# Patient Record
Sex: Female | Born: 1950 | Race: White | Hispanic: No | Marital: Single | Smoking: Never smoker
Health system: Southern US, Community
[De-identification: ages and names within clinical notes are randomized; demographics above are authoritative.]

## PROBLEM LIST (undated history)

## (undated) DIAGNOSIS — I10 Essential (primary) hypertension: Secondary | ICD-10-CM

## (undated) DIAGNOSIS — E7800 Pure hypercholesterolemia, unspecified: Secondary | ICD-10-CM

## (undated) DIAGNOSIS — K219 Gastro-esophageal reflux disease without esophagitis: Secondary | ICD-10-CM

## (undated) HISTORY — PX: OTHER SURGICAL HISTORY: SHX169

## (undated) HISTORY — PX: BREAST SURGERY: SHX581

## (undated) HISTORY — PX: UTERINE FIBROID SURGERY: SHX826

---

## 2008-10-04 ENCOUNTER — Ambulatory Visit: Payer: Self-pay

## 2012-01-17 ENCOUNTER — Ambulatory Visit: Payer: Self-pay | Admitting: Specialist

## 2012-01-25 ENCOUNTER — Ambulatory Visit: Payer: Self-pay | Admitting: Specialist

## 2012-05-22 ENCOUNTER — Ambulatory Visit: Payer: Self-pay | Admitting: Specialist

## 2013-10-26 ENCOUNTER — Ambulatory Visit: Payer: Self-pay | Admitting: Unknown Physician Specialty

## 2013-10-28 LAB — PATHOLOGY REPORT

## 2013-12-27 ENCOUNTER — Emergency Department: Payer: Self-pay | Admitting: Emergency Medicine

## 2014-06-22 NOTE — Op Note (Signed)
PATIENT NAME:  Karla Martin, Karla Martin MR#:  [id number] DATE OF BIRTH:  04-25-1950  DATE OF PROCEDURE:  01/25/2012  PREOPERATIVE DIAGNOSES:  1. Right carpal tunnel syndrome.  2. Left wrist mass.   POSTOPERATIVE DIAGNOSES:  1. Right carpal tunnel syndrome.  2. Left wrist mass.   PROCEDURES:  1. Right carpal tunnel release.  2. Attempted aspiration of mass, left wrist.   SURGEON: Christophe Louis, M.D.   ANESTHESIA: General.   COMPLICATIONS: None.  TOURNIQUET TIME: Approximately 20 minutes.   DESCRIPTION OF PROCEDURE: After adequate induction of general anesthesia, the right upper extremity is thoroughly prepped with alcohol and ChloraPrep and draped in standard sterile fashion. The extremity is wrapped out with the Esmarch bandage and pneumatic tourniquet elevated to 250 mmHg. Under loupe magnification, standard volar carpal tunnel incision is made and the dissection carried down to the transverse retinacular ligament. The ligament is incised in the midportion with the knife under direct vision. The distal release is performed with the small scissors and the proximal release is performed with the small scissors and the carpal tunnel scissors. Careful check is made to be sure that complete release had been obtained, both proximally and distally. There is seen to be moderate compression of the nerve directly beneath the ligament. The wound is thoroughly irrigated multiple times. Skin edges are infiltrated with 0.5% plain Marcaine. The skin is closed with 4-0 nylon, a soft bulky dressing is applied, and the tourniquet is released. At the left wrist, the area over the mass, at the radial aspect of the wrist, is fully prepped with alcohol and a 10 mL syringe and 18-gauge needle are used to attempt to aspirate any fluid from the mass and none could be obtained. Compression is applied along with the Band-Aid. The patient is returned to the recovery room in satisfactory condition having tolerated the procedure  quite well. ____________________________ Lucas Mallow, MD ces:slb D: 01/25/2012 08:38:50 ET T: 01/25/2012 10:14:54 ET JOB#: [id number]  cc: Lucas Mallow, MD, <Dictator> Lucas Mallow MD ELECTRONICALLY SIGNED 02/03/2012 13:42

## 2015-05-26 ENCOUNTER — Ambulatory Visit (INDEPENDENT_AMBULATORY_CARE_PROVIDER_SITE_OTHER): Payer: Managed Care, Other (non HMO)

## 2015-05-26 ENCOUNTER — Ambulatory Visit (INDEPENDENT_AMBULATORY_CARE_PROVIDER_SITE_OTHER): Payer: Managed Care, Other (non HMO) | Admitting: Podiatry

## 2015-05-26 ENCOUNTER — Encounter: Payer: Self-pay | Admitting: Podiatry

## 2015-05-26 VITALS — BP 130/73 | HR 80 | Resp 18

## 2015-05-26 DIAGNOSIS — S92912A Unspecified fracture of left toe(s), initial encounter for closed fracture: Secondary | ICD-10-CM

## 2015-05-26 DIAGNOSIS — R52 Pain, unspecified: Secondary | ICD-10-CM | POA: Diagnosis not present

## 2015-05-26 DIAGNOSIS — S92919A Unspecified fracture of unspecified toe(s), initial encounter for closed fracture: Secondary | ICD-10-CM | POA: Insufficient documentation

## 2015-05-26 NOTE — Patient Instructions (Signed)
Toe Fracture °A toe fracture is a break in one of the toe bones (phalanges). °CAUSES °This condition may be caused by: °· Dropping a heavy object on your toe. °· Stubbing your toe. °· Overusing your toe or doing repetitive exercise. °· Twisting or stretching your toe out of place. °RISK FACTORS °This condition is more likely to develop in people who: °· Play contact sports. °· Have a bone disease. °· Have a low calcium level. °SYMPTOMS °The main symptoms of this condition are swelling and pain in the toe. The pain may get worse with standing or walking. Other symptoms include: °· Bruising. °· Stiffness. °· Numbness. °· A change in the way the toe looks. °· Broken bones that poke through the skin. °· Blood beneath the toenail. °DIAGNOSIS °This condition is diagnosed with a physical exam. You may also have X-rays. °TREATMENT  °Treatment for this condition depends on the type of fracture and its severity. Treatment may involve: °· Taping the broken toe to a toe that is next to it (buddy taping). This is the most common treatment for fractures in which the bone has not moved out of place (nondisplaced fracture). °· Wearing a shoe that has a wide, rigid sole to protect the toe and to limit its movement. °· Wearing a walking cast. °· Having a procedure to move the toe back into place. °· Surgery. This may be needed: °¨ If there are many pieces of broken bone that are out of place (displaced). °¨ If the toe joint breaks. °¨ If the bone breaks through the skin. °· Physical therapy. This is done to help regain movement and strength in the toe. °You may need follow-up X-rays to make sure that the bone is healing well and staying in position. °HOME CARE INSTRUCTIONS °If You Have a Cast: °· Do not stick anything inside the cast to scratch your skin. Doing that increases your risk of infection. °· Check the skin around the cast every day. Report any concerns to your health care provider. You may put lotion on dry skin around the  edges of the cast. Do not apply lotion to the skin underneath the cast. °· Do not put pressure on any part of the cast until it is fully hardened. This may take several hours. °· Keep the cast clean and dry. °Bathing °· Do not take baths, swim, or use a hot tub until your health care provider approves. Ask your health care provider if you can take showers. You may only be allowed to take sponge baths for bathing. °· If your health care provider approves bathing and showering, cover the cast or bandage (dressing) with a watertight plastic bag to protect it from water. Do not let the cast or dressing get wet. °Managing Pain, Stiffness, and Swelling °· If you do not have a cast, apply ice to the injured area, if directed. °¨ Put ice in a plastic bag. °¨ Place a towel between your skin and the bag. °¨ Leave the ice on for 20 minutes, 2-3 times per day. °· Move your toes often to avoid stiffness and to lessen swelling. °· Raise (elevate) the injured area above the level of your heart while you are sitting or lying down. °Driving °· Do not drive or operate heavy machinery while taking pain medicine. °· Do not drive while wearing a cast on a foot that you use for driving. °Activity °· Return to your normal activities as directed by your health care provider. Ask your health care   provider what activities are safe for you. °· Perform exercises daily as directed by your health care provider or physical therapist. °Safety °· Do not use the injured limb to support your body weight until your health care provider says that you can. Use crutches or other assistive devices as directed by your health care provider. °General Instructions °· If your toe was treated with buddy taping, follow your health care provider's instructions for changing the gauze and tape. Change it more often: °¨ The gauze and tape get wet. If this happens, dry the space between the toes. °¨ The gauze and tape are too tight and cause your toe to become pale  or numb. °· Wear a protective shoe as directed by your health care provider. If you were not given a protective shoe, wear sturdy, supportive shoes. Your shoes should not pinch your toes and should not fit tightly against your toes. °· Do not use any tobacco products, including cigarettes, chewing tobacco, or e-cigarettes. Tobacco can delay bone healing. If you need help quitting, ask your health care provider. °· Take medicines only as directed by your health care provider. °· Keep all follow-up visits as directed by your health care provider. This is important. °SEEK MEDICAL CARE IF: °· You have a fever. °· Your pain medicine is not helping. °· Your toe is cold. °· Your toe is numb. °· You still have pain after one week of rest and treatment. °· You still have pain after your health care provider has said that you can start walking again. °· You have pain, tingling, or numbness in your foot that is not going away. °SEEK IMMEDIATE MEDICAL CARE IF: °· You have severe pain. °· You have redness or inflammation in your toe that is getting worse. °· You have pain or numbness in your toe that is getting worse. °· Your toe turns blue. °  °This information is not intended to replace advice given to you by your health care provider. Make sure you discuss any questions you have with your health care provider. °  °Document Released: 02/17/2000 Document Revised: 11/10/2014 Document Reviewed: 12/16/2013 °Elsevier Interactive Patient Education ©2016 [employer] ° °

## 2015-05-26 NOTE — Progress Notes (Signed)
   Subjective:    Patient ID: Karla Martin, female    DOB: [birth year], 65 y.o.   MRN: [id number]  HPI  65 year old female presents the also concerns of left fifth toe pain. She states that she stubbed her toe at work at a desk about 4 weeks ago. She said that she has some swelling and she is been icing it has slowly been getting better although does continue to be somewhat painful. She states that she can wear open toed shoes or any problems that when she wears a loafer it is the pressure to the area is painful. No other treatment. No other complaints.  Review of Systems  All other systems reviewed and are negative.      Objective:   Physical Exam General: AAO x3, NAD  Dermatological: Skin is warm, dry and supple bilateral. Nails x 10 are well manicured; remaining integument appears unremarkable at this time. There are no open sores, no preulcerative lesions, no rash or signs of infection present.  Vascular: Dorsalis Pedis artery and Posterior Tibial artery pedal pulses are 2/4 bilateral with immedate capillary fill time. Pedal hair growth present. No varicosities and no lower extremity edema present bilateral. There is no pain with calf compression, swelling, warmth, erythema.   Neruologic: Grossly intact via light touch bilateral. Vibratory intact via tuning fork bilateral. Protective threshold with Semmes Wienstein monofilament intact to all pedal sites bilateral. Patellar and Achilles deep tendon reflexes 2+ bilateral. No Babinski or clonus noted bilateral.   Musculoskeletal: There is mild tenderness palpation to the base of the left fifth proximal phalanx. There is pain upon abduction of the toe. There is slight edema along the base the toe without any erythema or increase in warmth. No open lesion. The toes at the rectus position. No tenderness the metatarsal. No other areas of tenderness to bilateral lower extremity is.  Gait: Unassisted, Nonantalgic.       Assessment & Plan:    65 year old female left fifth digit fracture -Treatment options discussed including all alternatives, risks, and complications -X-rays were obtained and reviewed with the patient. There does appear to be a radiolucent line on the base of the proximal pharynx the left fifth toe consistent with a fracture. -At this time recommended to tape the toe daily for splinting and to help control the swelling. Ice to the area. Wear open toed shoe. -Follow-up in 3-4 weeks if symptoms continue or sooner if any issues are to arise. Call any questions or concerns the meantime.  Celesta Gentile, DPM

## 2015-09-13 ENCOUNTER — Ambulatory Visit: Payer: Managed Care, Other (non HMO)

## 2015-09-13 ENCOUNTER — Ambulatory Visit (INDEPENDENT_AMBULATORY_CARE_PROVIDER_SITE_OTHER): Payer: Managed Care, Other (non HMO) | Admitting: Podiatry

## 2015-09-13 ENCOUNTER — Ambulatory Visit (INDEPENDENT_AMBULATORY_CARE_PROVIDER_SITE_OTHER): Payer: Managed Care, Other (non HMO)

## 2015-09-13 DIAGNOSIS — M7741 Metatarsalgia, right foot: Secondary | ICD-10-CM | POA: Diagnosis not present

## 2015-09-13 DIAGNOSIS — M79671 Pain in right foot: Secondary | ICD-10-CM

## 2015-09-13 DIAGNOSIS — M779 Enthesopathy, unspecified: Secondary | ICD-10-CM | POA: Diagnosis not present

## 2015-09-13 NOTE — Progress Notes (Signed)
Patient ID: Karla Martin, female   DOB: 09-Feb-1951, 65 y.o.   MRN: [id number]  Subjective: 65 year old female presents the office today for concerns of pain in the ball of her foot on the right side which is been ongoing for about 1 month. She has been taping her toes together and this has been helping her pain. She denies any recent injury or trauma to the area. Denies any swelling or redness. She's dusted the pain has gotten better since it started. She denies any change or increase in activity. No numbness or tingling to her toes. No other treatment.  Denies any systemic complaints such as fevers, chills, nausea, vomiting. No acute changes since last appointment, and no other complaints at this time.   Objective: AAO x3, NAD DP/PT pulses palpable bilaterally, CRT less than 3 seconds At this time there is no tenderness palpation on the metatarsals or with the interspaces. There is no pain or crepitation first MTPJ range of motion. She states the office on that she's having pain is when she does stand or put pressure to her foot and toes lateral walking. There does appear to be a very faint amount of edema to the dorsal aspect of the second and fourth MPJs without any erythema or increase in warmth. There is no fluctuance or crepitus. No open sores. Mild restriction in dorsiflexion right first MTPJ range of motion. No areas of pinpoint bony tenderness or pain with vibratory sensation. MMT 5/5, ROM WNL. No edema, erythema, increase in warmth to bilateral lower extremities.  No open lesions or pre-ulcerative lesions.  No pain with calf compression, swelling, warmth, erythema  Assessment: Right foot capsulitis, metatarsalgia  Plan: -All treatment options discussed with the patient including all alternatives, risks, complications.  -X-rays were obtained and reviewed with the patient. No definitive evidence of acute fracture or stress fracture at this time. -She states that his does better when she  wears a more supportive shoe and I discussed with her shoe gear modifications as well as orthotics to help support the arch of her foot. Also dispensed metatarsal pads. Prescribed meloxicam to take as needed as well as discussing side effects the medication. Ice to the area. Follow-up in 4 to symptoms continue or sooner if any issues are to arise.  Celesta Gentile, DPM

## 2015-10-11 ENCOUNTER — Ambulatory Visit: Payer: Managed Care, Other (non HMO) | Admitting: Podiatry

## 2017-09-27 ENCOUNTER — Encounter: Payer: Self-pay | Admitting: Emergency Medicine

## 2017-09-27 ENCOUNTER — Emergency Department: Payer: Medicare Other

## 2017-09-27 ENCOUNTER — Emergency Department
Admission: EM | Admit: 2017-09-27 | Discharge: 2017-09-27 | Disposition: A | Discharge status: Home / Self Care | Payer: Medicare Other | Attending: Emergency Medicine | Admitting: Emergency Medicine

## 2017-09-27 DIAGNOSIS — Z79899 Other long term (current) drug therapy: Secondary | ICD-10-CM | POA: Diagnosis not present

## 2017-09-27 DIAGNOSIS — R079 Chest pain, unspecified: Secondary | ICD-10-CM | POA: Diagnosis not present

## 2017-09-27 DIAGNOSIS — Z7982 Long term (current) use of aspirin: Secondary | ICD-10-CM | POA: Diagnosis not present

## 2017-09-27 LAB — CBC
HCT: 37.3 % (ref 35.0–47.0)
Hemoglobin: 12.9 g/dL (ref 12.0–16.0)
MCH: 30.9 pg (ref 26.0–34.0)
MCHC: 34.6 g/dL (ref 32.0–36.0)
MCV: 89.3 fL (ref 80.0–100.0)
PLATELETS: 230 10*3/uL (ref 150–440)
RBC: 4.17 MIL/uL (ref 3.80–5.20)
RDW: 13.1 % (ref 11.5–14.5)
WBC: 5 10*3/uL (ref 3.6–11.0)

## 2017-09-27 LAB — TROPONIN I
Troponin I: <0.03 ng/mL (ref <0.03)
Troponin I: <0.03 ng/mL (ref <0.03)

## 2017-09-27 LAB — BASIC METABOLIC PANEL
Anion gap: 7 (ref 5–15)
BUN: 16 mg/dL (ref 8–23)
CALCIUM: 9.4 mg/dL (ref 8.9–10.3)
CO2: 27 mmol/L (ref 22–32)
CREATININE: 0.76 mg/dL (ref 0.44–1.00)
Chloride: 106 mmol/L (ref 98–111)
GFR calc Af Amer: >60 mL/min (ref >60)
GLUCOSE: 120 mg/dL — AB (ref 70–99)
Potassium: 4 mmol/L (ref 3.5–5.1)
Sodium: 140 mmol/L (ref 135–145)

## 2017-09-27 MED ORDER — KETAMINE HCL 10 MG/ML IJ SOLN: 0.3000 mg/kg | Freq: Once | INTRAMUSCULAR | Status: DC

## 2017-09-27 NOTE — ED Provider Notes (Signed)
[facility] Emergency Department Provider Note  Time seen: 9:36 AM  I have reviewed the triage vital signs and the nursing notes.   HISTORY  Chief Complaint Chest Pain    HPI Karla Martin is a 67 y.o. female with no significant past medical history presents to the emergency department for chest pain.  According to the patient around 730 this morning she developed central chest pain felt like a band squeezing around her chest.  States some shortness of breath but denies any nausea or diaphoresis.  Patient came to the emergency department for evaluation.  Upon arrival she states the chest pain is largely resolved but she continues to have some mild pain in her back which "feels muscular."  Denies any pleuritic nature to the pain now or at any time.  Denies any leg pain or swelling.  Denies any cardiac history.  States when the pain did occur it was moderate squeezing type pain.  But is now largely gone.  Patient states she had a stress test less than 2 years ago which was negative/normal.   History reviewed. No pertinent past medical history.  Patient Active Problem List   Diagnosis Date Noted  . Toe fracture 05/26/2015    Past Surgical History:  Procedure Laterality Date  . UTERINE FIBROID SURGERY      Prior to Admission medications   Medication Sig Start Date End Date Taking? Authorizing Provider  Ascorbic Acid (VITAMIN C) 1000 MG tablet Take by mouth.    [provider]  aspirin EC 81 MG tablet Take by mouth.    [provider]  calcium-vitamin D (CALCIUM 500/D) 500-200 MG-UNIT tablet Take by mouth.    [provider]  ferrous sulfate 325 (65 FE) MG tablet Take by mouth.    [provider]  pantoprazole (PROTONIX) 40 MG tablet  03/06/15   [provider]  Vitamin D, Ergocalciferol, (DRISDOL) 50000 units CAPS capsule Take by mouth.    [provider]    Allergies  Allergen Reactions  . Penicillins      No family history on file.  Social History Social History   Tobacco Use  . Smoking status: Never Smoker  . Smokeless tobacco: Never Used  Substance Use Topics  . Alcohol use: Yes    Alcohol/week: 0.0 oz  . Drug use: Not on file    Review of Systems Constitutional: Negative for fever. Cardiovascular: Squeezing chest pain now resolved Respiratory: Mild shortness of breath, now resolved Gastrointestinal: Negative for abdominal pain, vomiting  Genitourinary: Negative for urinary compaints Musculoskeletal: Negative for leg pain or swelling Skin: Negative for skin complaints  Neurological: Negative for headache All other ROS negative  ____________________________________________   PHYSICAL EXAM:  VITAL SIGNS: ED Triage Vitals  Enc Vitals Group     BP 09/27/17 0804 (!) 149/70     Pulse Rate 09/27/17 0804 83     Resp 09/27/17 0804 18     Temp 09/27/17 0804 97.6 F (36.4 C)     Temp Source 09/27/17 0804 Oral     SpO2 09/27/17 0804 100 %     Weight 09/27/17 0804 166 lb (75.3 kg)     Height 09/27/17 0804 5\' 4"  (1.626 m)     Head Circumference --      Peak Flow --      Pain Score 09/27/17 0802 3     Pain Loc --      Pain Edu? --  Excl. in [place]? --    Constitutional: Alert and oriented. Well appearing and in no distress. Eyes: Normal exam ENT   Head: Normocephalic and atraumatic.   Mouth/Throat: Mucous membranes are moist. Cardiovascular: Normal rate, regular rhythm. No murmur Respiratory: Normal respiratory effort without tachypnea nor retractions. Breath sounds are clear  Gastrointestinal: Soft and nontender. No distention.  Musculoskeletal: Nontender with normal range of motion in all extremities. No lower extremity tenderness or edema. Neurologic:  Normal speech and language. No gross focal neurologic deficits Skin:  Skin is warm, dry and intact.  Psychiatric: Mood and affect are normal. Speech and behavior are normal.    ____________________________________________    EKG  EKG reviewed and interpreted by myself shows normal sinus rhythm at 78 bpm with a narrow QRS, normal axis, normal intervals, no ST changes.  Normal EKG  ____________________________________________    RADIOLOGY  Chest x-ray shows no edema or consolidation.  ____________________________________________   INITIAL IMPRESSION / ASSESSMENT AND PLAN / ED COURSE  Pertinent labs & imaging results that were available during my care of the patient were reviewed by me and considered in my medical decision making (see chart for details).  Patient presents to the emergency department for chest pain starting this morning around 730.  States that has since resolved.  Described as a tightening/bandlike sensation across her chest.  Now gone.  Denies any nausea or diaphoresis at any point.  No leg pain or swelling.  Differential would include ACS, chest wall pain, muscular skeletal pain, pneumonia, pneumothorax.  Patient's labs are reassuring with a negative troponin, chest x-ray is normal.  EKG is normal.  We will repeat a troponin and continue to closely monitor.  Patient is very well-appearing at this time, calmly laying in bed.  Patient's lab work appears well.  Repeat troponin continues to be normal.  Patient continues to be chest pain-free in the emergency department appears very well.  We will discharge home with PCP follow-up to discuss further work-up including a stress test.  Patient agreeable to plan of care.  ____________________________________________   FINAL CLINICAL IMPRESSION(S) / ED DIAGNOSES  Chest pain    Harvest Dark, MD 09/27/17 1237

## 2017-09-27 NOTE — ED Triage Notes (Signed)
Pt arrived with complaints of sudden onset of chest pain with minimal exertion this morning at 0730. Pt reports a sensation of heaviness in her chest. Pt reports taking asa prior to arrival. Pt reports the pain is now in her left back/shoulder blade. Pt denies shortness of breath currently.

## 2018-11-04 ENCOUNTER — Other Ambulatory Visit: Payer: Self-pay | Admitting: Internal Medicine

## 2018-11-04 DIAGNOSIS — Z1231 Encounter for screening mammogram for malignant neoplasm of breast: Secondary | ICD-10-CM

## 2019-01-15 ENCOUNTER — Other Ambulatory Visit
Admission: RE | Admit: 2019-01-15 | Discharge: 2019-01-15 | Disposition: A | Discharge status: Home / Self Care | Payer: Medicare Other | Source: Ambulatory Visit | Attending: Internal Medicine | Admitting: Internal Medicine

## 2019-01-15 ENCOUNTER — Other Ambulatory Visit: Payer: Self-pay

## 2019-01-15 DIAGNOSIS — Z01812 Encounter for preprocedural laboratory examination: Secondary | ICD-10-CM | POA: Diagnosis present

## 2019-01-15 DIAGNOSIS — Z20828 Contact with and (suspected) exposure to other viral communicable diseases: Secondary | ICD-10-CM | POA: Insufficient documentation

## 2019-01-15 LAB — SARS CORONAVIRUS 2 (TAT 6-24 HRS): SARS Coronavirus 2: NEGATIVE

## 2019-01-16 ENCOUNTER — Encounter: Payer: Self-pay | Admitting: *Deleted

## 2019-01-19 ENCOUNTER — Ambulatory Visit: Payer: Medicare Other | Admitting: Anesthesiology

## 2019-01-19 ENCOUNTER — Ambulatory Visit
Admission: RE | Admit: 2019-01-19 | Discharge: 2019-01-19 | Disposition: A | Discharge status: Home / Self Care | Payer: Medicare Other | Attending: Internal Medicine | Admitting: Internal Medicine

## 2019-01-19 ENCOUNTER — Encounter: Payer: Self-pay | Admitting: *Deleted

## 2019-01-19 ENCOUNTER — Encounter
Admission: RE | Disposition: A | Discharge status: Home / Self Care | Payer: Self-pay | Source: Home / Self Care | Attending: Internal Medicine

## 2019-01-19 DIAGNOSIS — Z1211 Encounter for screening for malignant neoplasm of colon: Secondary | ICD-10-CM | POA: Diagnosis not present

## 2019-01-19 DIAGNOSIS — Z79899 Other long term (current) drug therapy: Secondary | ICD-10-CM | POA: Insufficient documentation

## 2019-01-19 DIAGNOSIS — E7800 Pure hypercholesterolemia, unspecified: Secondary | ICD-10-CM | POA: Insufficient documentation

## 2019-01-19 DIAGNOSIS — K219 Gastro-esophageal reflux disease without esophagitis: Secondary | ICD-10-CM | POA: Diagnosis not present

## 2019-01-19 DIAGNOSIS — I10 Essential (primary) hypertension: Secondary | ICD-10-CM | POA: Diagnosis not present

## 2019-01-19 DIAGNOSIS — Z86010 Personal history of colonic polyps: Secondary | ICD-10-CM | POA: Diagnosis not present

## 2019-01-19 DIAGNOSIS — Z7982 Long term (current) use of aspirin: Secondary | ICD-10-CM | POA: Insufficient documentation

## 2019-01-19 HISTORY — DX: Pure hypercholesterolemia, unspecified: E78.00

## 2019-01-19 HISTORY — DX: Gastro-esophageal reflux disease without esophagitis: K21.9

## 2019-01-19 HISTORY — PX: COLONOSCOPY WITH PROPOFOL: SHX5780

## 2019-01-19 HISTORY — DX: Essential (primary) hypertension: I10

## 2019-01-19 SURGERY — COLONOSCOPY WITH PROPOFOL
Anesthesia: General

## 2019-01-19 MED ORDER — SODIUM CHLORIDE 0.9 % IV SOLN
INTRAVENOUS | Status: DC
  Administered 2019-01-19: 12:00:00 1000 mL via INTRAVENOUS

## 2019-01-19 MED ORDER — LIDOCAINE HCL (PF) 2 % IJ SOLN
INTRAMUSCULAR | Status: AC
  Filled 2019-01-19: qty 10

## 2019-01-19 MED ORDER — PROPOFOL 10 MG/ML IV BOLUS
INTRAVENOUS | Status: DC | PRN
  Administered 2019-01-19: 20 mg via INTRAVENOUS
  Administered 2019-01-19: 70 mg via INTRAVENOUS

## 2019-01-19 MED ORDER — LIDOCAINE HCL (CARDIAC) PF 100 MG/5ML IV SOSY
PREFILLED_SYRINGE | INTRAVENOUS | Status: DC | PRN
  Administered 2019-01-19: 50 mg via INTRAVENOUS

## 2019-01-19 MED ORDER — PROPOFOL 500 MG/50ML IV EMUL
INTRAVENOUS | Status: AC
  Filled 2019-01-19: qty 50

## 2019-01-19 MED ORDER — PROPOFOL 500 MG/50ML IV EMUL
INTRAVENOUS | Status: DC | PRN
  Administered 2019-01-19: 130 ug/kg/min via INTRAVENOUS

## 2019-01-19 NOTE — Anesthesia Preprocedure Evaluation (Signed)
Anesthesia Evaluation  Patient identified by MRN, date of birth, ID band Patient awake    Reviewed: Allergy & Precautions, NPO status , Patient's Chart, lab work & pertinent test results  History of Anesthesia Complications Negative for: history of anesthetic complications  Airway Mallampati: II  TM Distance: <3 FB Neck ROM: Full    Dental no notable dental hx.    Pulmonary neg pulmonary ROS, neg sleep apnea, neg COPD,    breath sounds clear to auscultation- rhonchi (-) wheezing      Cardiovascular hypertension, Pt. on medications (-) CAD, (-) Past MI, (-) Cardiac Stents and (-) CABG  Rhythm:Regular Rate:Normal - Systolic murmurs and - Diastolic murmurs    Neuro/Psych neg Seizures negative neurological ROS  negative psych ROS   GI/Hepatic Neg liver ROS, GERD  ,  Endo/Other  negative endocrine ROSneg diabetes  Renal/GU negative Renal ROS     Musculoskeletal negative musculoskeletal ROS (+)   Abdominal (+) - obese,   Peds  Hematology negative hematology ROS (+)   Anesthesia Other Findings Past Medical History: No date: GERD (gastroesophageal reflux disease) No date: Hypercholesteremia No date: Hypertension   Reproductive/Obstetrics                             Anesthesia Physical Anesthesia Plan  ASA: II  Anesthesia Plan: General   Post-op Pain Management:    Induction: Intravenous  PONV Risk Score and Plan: 2 and Propofol infusion  Airway Management Planned: Natural Airway  Additional Equipment:   Intra-op Plan:   Post-operative Plan:   Informed Consent: I have reviewed the patients History and Physical, chart, labs and discussed the procedure including the risks, benefits and alternatives for the proposed anesthesia with the patient or authorized representative who has indicated his/her understanding and acceptance.     Dental advisory given  Plan Discussed with: CRNA  and Anesthesiologist  Anesthesia Plan Comments:         Anesthesia Quick Evaluation

## 2019-01-19 NOTE — H&P (Signed)
Outpatient short stay form Pre-procedure 01/19/2019 9:57 AM Holli Rengel K. Alice Reichert, M.D.  Primary Physician: Lamonte Sakai, M.D.  Reason for visit:  Personal hx of colon polyps -Adenomatous colon polyp x 2 - 2015.  History of present illness:                            Patient presents for colonoscopy for a personal hx of colon polyps. The patient denies abdominal pain, abnormal weight loss or rectal bleeding.    No current facility-administered medications for this encounter.   Current Outpatient Medications:  .  amLODipine (NORVASC) 5 MG tablet, Take 5 mg by mouth daily., Disp: , Rfl:  .  simvastatin (ZOCOR) 10 MG tablet, Take 10 mg by mouth daily., Disp: , Rfl:  .  Ascorbic Acid (VITAMIN C) 1000 MG tablet, Take by mouth., Disp: , Rfl:  .  aspirin EC 81 MG tablet, Take by mouth., Disp: , Rfl:  .  calcium-vitamin D (CALCIUM 500/D) 500-200 MG-UNIT tablet, Take by mouth., Disp: , Rfl:  .  ferrous sulfate 325 (65 FE) MG tablet, Take by mouth., Disp: , Rfl:  .  pantoprazole (PROTONIX) 40 MG tablet, , Disp: , Rfl:  .  Vitamin D, Ergocalciferol, (DRISDOL) 50000 units CAPS capsule, Take by mouth., Disp: , Rfl:   No medications prior to admission.     Allergies  Allergen Reactions  . Erythromycin   . Penicillins      Past Medical History:  Diagnosis Date  . GERD (gastroesophageal reflux disease)   . Hypercholesteremia   . Hypertension     Review of systems:  Otherwise negative.    Physical Exam  Gen: Alert, oriented. Appears stated age.  HEENT: [place]/AT. PERR[place]. Lungs: CTA, no wheezes. CV: RR nl S1, S2. Abd: soft, benign, no masses. BS+ Ext: No edema. Pulses 2+    Planned procedures: Proceed with colonoscopy. The patient understands the nature of the planned procedure, indications, risks, alternatives and potential complications including but not limited to bleeding, infection, perforation, damage to internal organs and possible oversedation/side effects from anesthesia. The  patient agrees and gives consent to proceed.  Please refer to procedure notes for findings, recommendations and patient disposition/instructions.     Lucky Trotta K. Alice Reichert, M.D. Gastroenterology 01/19/2019  9:57 AM

## 2019-01-19 NOTE — Anesthesia Postprocedure Evaluation (Signed)
Anesthesia Post Note  Patient: Karla Martin  Procedure(s) Performed: COLONOSCOPY WITH PROPOFOL (N/A )  Patient location during evaluation: PACU Anesthesia Type: General Level of consciousness: awake and alert and oriented Pain management: pain level controlled Vital Signs Assessment: post-procedure vital signs reviewed and stable Respiratory status: spontaneous breathing, nonlabored ventilation and respiratory function stable Cardiovascular status: blood pressure returned to baseline and stable Postop Assessment: no signs of nausea or vomiting Anesthetic complications: no     Last Vitals:  Vitals:   01/19/19 1333 01/19/19 1343  BP: 130/67 (!) 141/63  Pulse: 70   Resp: 18 16  Temp:    SpO2: 100%     Last Pain:  Vitals:   01/19/19 1333  TempSrc:   PainSc: 0-No pain                 Razan Siler

## 2019-01-19 NOTE — Interval H&P Note (Signed)
History and Physical Interval Note:  01/19/2019 12:50 PM  Karla Martin  has presented today for surgery, with the diagnosis of PERSONAL HX.OF COLON POLYPS.  The various methods of treatment have been discussed with the patient and family. After consideration of risks, benefits and other options for treatment, the patient has consented to  Procedure(s): COLONOSCOPY WITH PROPOFOL (N/A) as a surgical intervention.  The patient's history has been reviewed, patient examined, no change in status, stable for surgery.  I have reviewed the patient's chart and labs.  Questions were answered to the patient's satisfaction.     [place], [place]

## 2019-01-19 NOTE — Op Note (Signed)
[facility] Patient Name: Karla Martin Procedure Date: 01/19/2019 12:50 PM MRN: [id number] Account #: [id number] Date of Birth: 27-Jan-1951 Admit Type: Outpatient Age: 68 Room: [facility] ENDO ROOM 1 Gender: Female Note Status: Finalized Procedure:             Colonoscopy Indications:           High risk colon cancer surveillance: Personal history                         of multiple (3 or more) adenomas Providers:             Lorie Apley K. Toledo MD, MD Medicines:             Propofol per Anesthesia Complications:         No immediate complications. Procedure:             Pre-Anesthesia Assessment:                        - The risks and benefits of the procedure and the                         sedation options and risks were discussed with the                         patient. All questions were answered and informed                         consent was obtained.                        - Patient identification and proposed procedure were                         verified prior to the procedure by the nurse. The                         procedure was verified in the procedure room.                        - ASA Grade Assessment: III - A patient with severe                         systemic disease.                        - After reviewing the risks and benefits, the patient                         was deemed in satisfactory condition to undergo the                         procedure.                        After obtaining informed consent, the colonoscope was                         passed under direct vision. Throughout the procedure,  the patient's blood pressure, pulse, and oxygen                         saturations were monitored continuously. The                         Colonoscope was introduced through the anus and                         advanced to the the cecum, identified by appendiceal                         orifice and  ileocecal valve. The colonoscopy was                         performed without difficulty. The patient tolerated                         the procedure well. The quality of the bowel                         preparation was good. The ileocecal valve, appendiceal                         orifice, and rectum were photographed. Findings:      The perianal and digital rectal examinations were normal. Pertinent       negatives include normal sphincter tone and no palpable rectal lesions.      Many small and large-mouthed diverticula were found in the sigmoid colon.      Non-bleeding internal hemorrhoids were found during retroflexion. The       hemorrhoids were Grade I (internal hemorrhoids that do not prolapse).      The exam was otherwise without abnormality. Impression:            - Diverticulosis in the sigmoid colon.                        - Non-bleeding internal hemorrhoids.                        - The examination was otherwise normal.                        - No specimens collected. Recommendation:        - Patient has a contact number available for                         emergencies. The signs and symptoms of potential                         delayed complications were discussed with the patient.                         Return to normal activities tomorrow. Written                         discharge instructions were provided to the patient.                        -  Resume previous diet.                        - Continue present medications.                        - Repeat colonoscopy in 5 years for surveillance.                        - Return to GI office PRN.                        - The findings and recommendations were discussed with                         the patient. Procedure Code(s):     --- Professional ---                        [id number], Colorectal cancer screening; colonoscopy on                         individual at high risk Diagnosis Code(s):     --- Professional ---                         K57.30, Diverticulosis of large intestine without                         perforation or abscess without bleeding                        K64.0, First degree hemorrhoids                        Z86.010, Personal history of colonic polyps CPT copyright 2019 [employer]. All rights reserved. The codes documented in this report are preliminary and upon coder review may  be revised to meet current compliance requirements. Efrain Sella MD, MD 01/19/2019 1:13:37 PM This report has been signed electronically. Number of Addenda: 0 Note Initiated On: 01/19/2019 12:50 PM Scope Withdrawal Time: 0 hours 6 minutes 6 seconds  Total Procedure Duration: 0 hours 10 minutes 43 seconds  Estimated Blood Loss:  Estimated blood loss: none.      [facility]

## 2019-01-19 NOTE — Anesthesia Post-op Follow-up Note (Signed)
Anesthesia QCDR form completed.        

## 2019-01-19 NOTE — Transfer of Care (Signed)
Immediate Anesthesia Transfer of Care Note  Patient: Karla Martin  Procedure(s) Performed: COLONOSCOPY WITH PROPOFOL (N/A )  Patient Location: PACU and Endoscopy Unit  Anesthesia Type:General  Level of Consciousness: awake, alert  and patient cooperative  Airway & Oxygen Therapy: Patient Spontanous Breathing  Post-op Assessment: Report given to RN and Post -op Vital signs reviewed and stable  Post vital signs: Reviewed and stable  Last Vitals:  Vitals Value Taken Time  BP 136/73 01/19/19 1314  Temp    Pulse 80 01/19/19 1314  Resp 15 01/19/19 1314  SpO2 100 % 01/19/19 1314  Vitals shown include unvalidated device data.  Last Pain:  Vitals:   01/19/19 1202  TempSrc: Skin  PainSc: 0-No pain         Complications: No apparent anesthesia complications

## 2019-06-15 ENCOUNTER — Encounter: Payer: Self-pay | Admitting: Dermatology

## 2019-06-15 ENCOUNTER — Ambulatory Visit (INDEPENDENT_AMBULATORY_CARE_PROVIDER_SITE_OTHER): Payer: Medicare Other | Admitting: Dermatology

## 2019-06-15 ENCOUNTER — Other Ambulatory Visit: Payer: Self-pay

## 2019-06-15 DIAGNOSIS — D1800 Hemangioma unspecified site: Secondary | ICD-10-CM

## 2019-06-15 DIAGNOSIS — L578 Other skin changes due to chronic exposure to nonionizing radiation: Secondary | ICD-10-CM

## 2019-06-15 DIAGNOSIS — L820 Inflamed seborrheic keratosis: Secondary | ICD-10-CM | POA: Diagnosis not present

## 2019-06-15 DIAGNOSIS — L821 Other seborrheic keratosis: Secondary | ICD-10-CM | POA: Diagnosis not present

## 2019-06-15 DIAGNOSIS — Z1283 Encounter for screening for malignant neoplasm of skin: Secondary | ICD-10-CM | POA: Diagnosis not present

## 2019-06-15 DIAGNOSIS — L918 Other hypertrophic disorders of the skin: Secondary | ICD-10-CM

## 2019-06-15 DIAGNOSIS — D229 Melanocytic nevi, unspecified: Secondary | ICD-10-CM

## 2019-06-15 NOTE — Progress Notes (Signed)
Follow-Up Visit   Subjective  Karla Martin is a 70 y.o. female who presents for the following: Annual Exam. Patient presents for skin cancer screening and total-body skin exam. Patient has an itchy spot on top of scalp that she would like treated, previously has been treated with LN2. Also a spot under braline at chest that is itchy and irritated. Present for a few years. She also has spots under each arm that are irritated.  No history of skin cancer.   The following portions of the chart were reviewed this encounter and updated as appropriate: Tobacco  Allergies  Meds  Problems  Med Hx  Surg Hx  Fam Hx      Review of Systems: No other skin or systemic complaints.  Objective  Well appearing patient in no apparent distress; mood and affect are within normal limits.  A full examination was performed including scalp, head, eyes, ears, nose, lips, neck, chest, axillae, abdomen, back, buttocks, bilateral upper extremities, bilateral lower extremities, hands, feet, fingers, toes, fingernails, and toenails. All findings within normal limits unless otherwise noted below.  Objective  Left Anterior Crown Scalp x 2, Left inframammary x 1 (3): Erythematous keratotic or waxy stuck-on papule or plaque.   Objective  Neck x 8 (8), Right Axilla x 1, Left Axilla x 1, Neck x 2 (4): Fleshy, skin-colored pedunculated papules.    Assessment & Plan  Inflamed seborrheic keratosis (3) Left Anterior Crown Scalp x 2, Left inframammary x 1  Destruction of lesion - Left Anterior Crown Scalp x 2, Left inframammary x 1 Complexity: simple   Destruction method: cryotherapy   Informed consent: discussed and consent obtained   Timeout:  patient name, date of birth, surgical site, and procedure verified Lesion destroyed using liquid nitrogen: Yes   Region frozen until ice ball extended beyond lesion: Yes   Outcome: patient tolerated procedure well with no complications   Post-procedure details: wound  care instructions given    Skin tag (12) Neck x 8 (8); Right Axilla x 1, Left Axilla x 1, Neck x 2 (4)  Benign, observe.    Destruction of lesion - Neck x 8 Complexity: simple   Destruction method: cryotherapy   Informed consent: discussed and consent obtained   Timeout:  patient name, date of birth, surgical site, and procedure verified Lesion destroyed using liquid nitrogen: Yes   Region frozen until ice ball extended beyond lesion: Yes   Outcome: patient tolerated procedure well with no complications   Post-procedure details: wound care instructions given    Epidermal / dermal shaving - Right Axilla x 1, Left Axilla x 1, Neck x 2  Informed consent: discussed and consent obtained   Anesthesia: the lesion was anesthetized in a standard fashion   Anesthetic:  1% lidocaine w/ epinephrine 1-100,000 buffered w/ 8.4% NaHCO3 Instrument used: scissors   Hemostasis achieved with: pressure, aluminum chloride and electrodesiccation   Outcome: patient tolerated procedure well   Post-procedure details: wound care instructions given     Skin cancer screening performed today.  Seborrheic Keratoses - Stuck-on, waxy, tan-brown papules and plaques  - Discussed benign etiology and prognosis. - Observe - Call for any changes  Hemangiomas - Red papules - Discussed benign nature - Observe - Call for any changes  Actinic Damage - diffuse scaly erythematous macules with underlying dyspigmentation - Recommend daily broad spectrum sunscreen SPF 30+ to sun-exposed areas, reapply every 2 hours as needed.  - Call for new or changing lesions.  Melanocytic  Nevi - Tan-brown and/or pink-flesh-colored symmetric macules and papules - Benign appearing on exam today - Observation - Call clinic for new or changing moles - Recommend daily use of broad spectrum spf 30+ sunscreen to sun-exposed areas.    Return 2-3 months, for recheck ISK's.   Graciella Belton, RMA, am acting as scribe for Sarina Ser, MD . Documentation: I have reviewed the above documentation for accuracy and completeness, and I agree with the above.  Sarina Ser, MD

## 2019-06-15 NOTE — Patient Instructions (Addendum)
Recommend daily broad spectrum sunscreen SPF 30+ to sun-exposed areas, reapply every 2 hours as needed. Call for new or changing lesions.  Cryotherapy Aftercare  . Wash gently with soap and water everyday.   . Apply Vaseline and Band-Aid daily until healed.  Wound Care Instructions  1. Cleanse wound gently with soap and water once a day then pat dry with clean gauze. Apply a thing coat of Petrolatum (petroleum jelly, "Vaseline") over the wound (unless you have an allergy to this). We recommend that you use a new, sterile tube of Vaseline. Do not pick or remove scabs. Do not remove the yellow or white "healing tissue" from the base of the wound.  2. Cover the wound with fresh, clean, nonstick gauze and secure with paper tape. You may use Band-Aids in place of gauze and tape if the would is small enough, but would recommend trimming much of the tape off as there is often too much. Sometimes Band-Aids can irritate the skin.  3. You should call the office for your biopsy report after 1 week if you have not already been contacted.  4. If you experience any problems, such as abnormal amounts of bleeding, swelling, significant bruising, significant pain, or evidence of infection, please call the office immediately.  5. FOR ADULT SURGERY PATIENTS: If you need something for pain relief you may take 1 extra strength Tylenol (acetaminophen) AND 2 Ibuprofen (200mg each) together every 4 hours as needed for pain. (do not take these if you are allergic to them or if you have a reason you should not take them.) Typically, you may only need pain medication for 1 to 3 days.     

## 2019-08-27 ENCOUNTER — Ambulatory Visit (INDEPENDENT_AMBULATORY_CARE_PROVIDER_SITE_OTHER): Payer: Medicare Other | Admitting: Dermatology

## 2019-08-27 ENCOUNTER — Other Ambulatory Visit: Payer: Self-pay

## 2019-08-27 DIAGNOSIS — L820 Inflamed seborrheic keratosis: Secondary | ICD-10-CM

## 2019-08-27 DIAGNOSIS — L821 Other seborrheic keratosis: Secondary | ICD-10-CM | POA: Diagnosis not present

## 2019-08-27 DIAGNOSIS — L578 Other skin changes due to chronic exposure to nonionizing radiation: Secondary | ICD-10-CM

## 2019-08-27 NOTE — Progress Notes (Signed)
   Follow-Up Visit   Subjective  Karla Martin is a 69 y.o. female who presents for the following: recheck ISKs (L ant crown x 2, L inframammary, pt said L inframammary resolved).  The following portions of the chart were reviewed this encounter and updated as appropriate:  Tobacco  Allergies  Meds  Problems  Med Hx  Surg Hx  Fam Hx      Review of Systems:  No other skin or systemic complaints except as noted in HPI or Assessment and Plan.  Objective  Well appearing patient in no apparent distress; mood and affect are within normal limits.  A focused examination was performed including scalp, chest. Relevant physical exam findings are noted in the Assessment and Plan.  Objective  L ant crown scalp x 1: Residual Erythematous keratotic or waxy stuck-on papule or plaque.    Assessment & Plan    Actinic Damage - diffuse scaly erythematous macules with underlying dyspigmentation - Recommend daily broad spectrum sunscreen SPF 30+ to sun-exposed areas, reapply every 2 hours as needed.  - Call for new or changing lesions.  Seborrheic Keratoses - Stuck-on, waxy, tan-brown papules and plaques  - Discussed benign etiology and prognosis. - Observe - Call for any changes  Inflamed seborrheic keratosis L ant crown scalp x 1  Destruction of lesion - L ant crown scalp x 1 Complexity: simple   Destruction method: cryotherapy   Informed consent: discussed and consent obtained   Timeout:  patient name, date of birth, surgical site, and procedure verified Lesion destroyed using liquid nitrogen: Yes   Region frozen until ice ball extended beyond lesion: Yes   Outcome: patient tolerated procedure well with no complications   Post-procedure details: wound care instructions given    Return for as scheduled for TBSE 06/16/2020.   I, Othelia Pulling, RMA, am acting as scribe for Sarina Ser, MD .  Documentation: I have reviewed the above documentation for accuracy and completeness,  and I agree with the above.  Sarina Ser, MD

## 2019-09-02 ENCOUNTER — Encounter: Payer: Self-pay | Admitting: Dermatology

## 2019-12-30 IMAGING — CR DG CHEST 2V
2 series · 2 of 2 positions shown · non-contrast
Comparison: None.

CLINICAL DATA: Chest pain

EXAM:
CHEST - 2 VIEW

[chest pa]
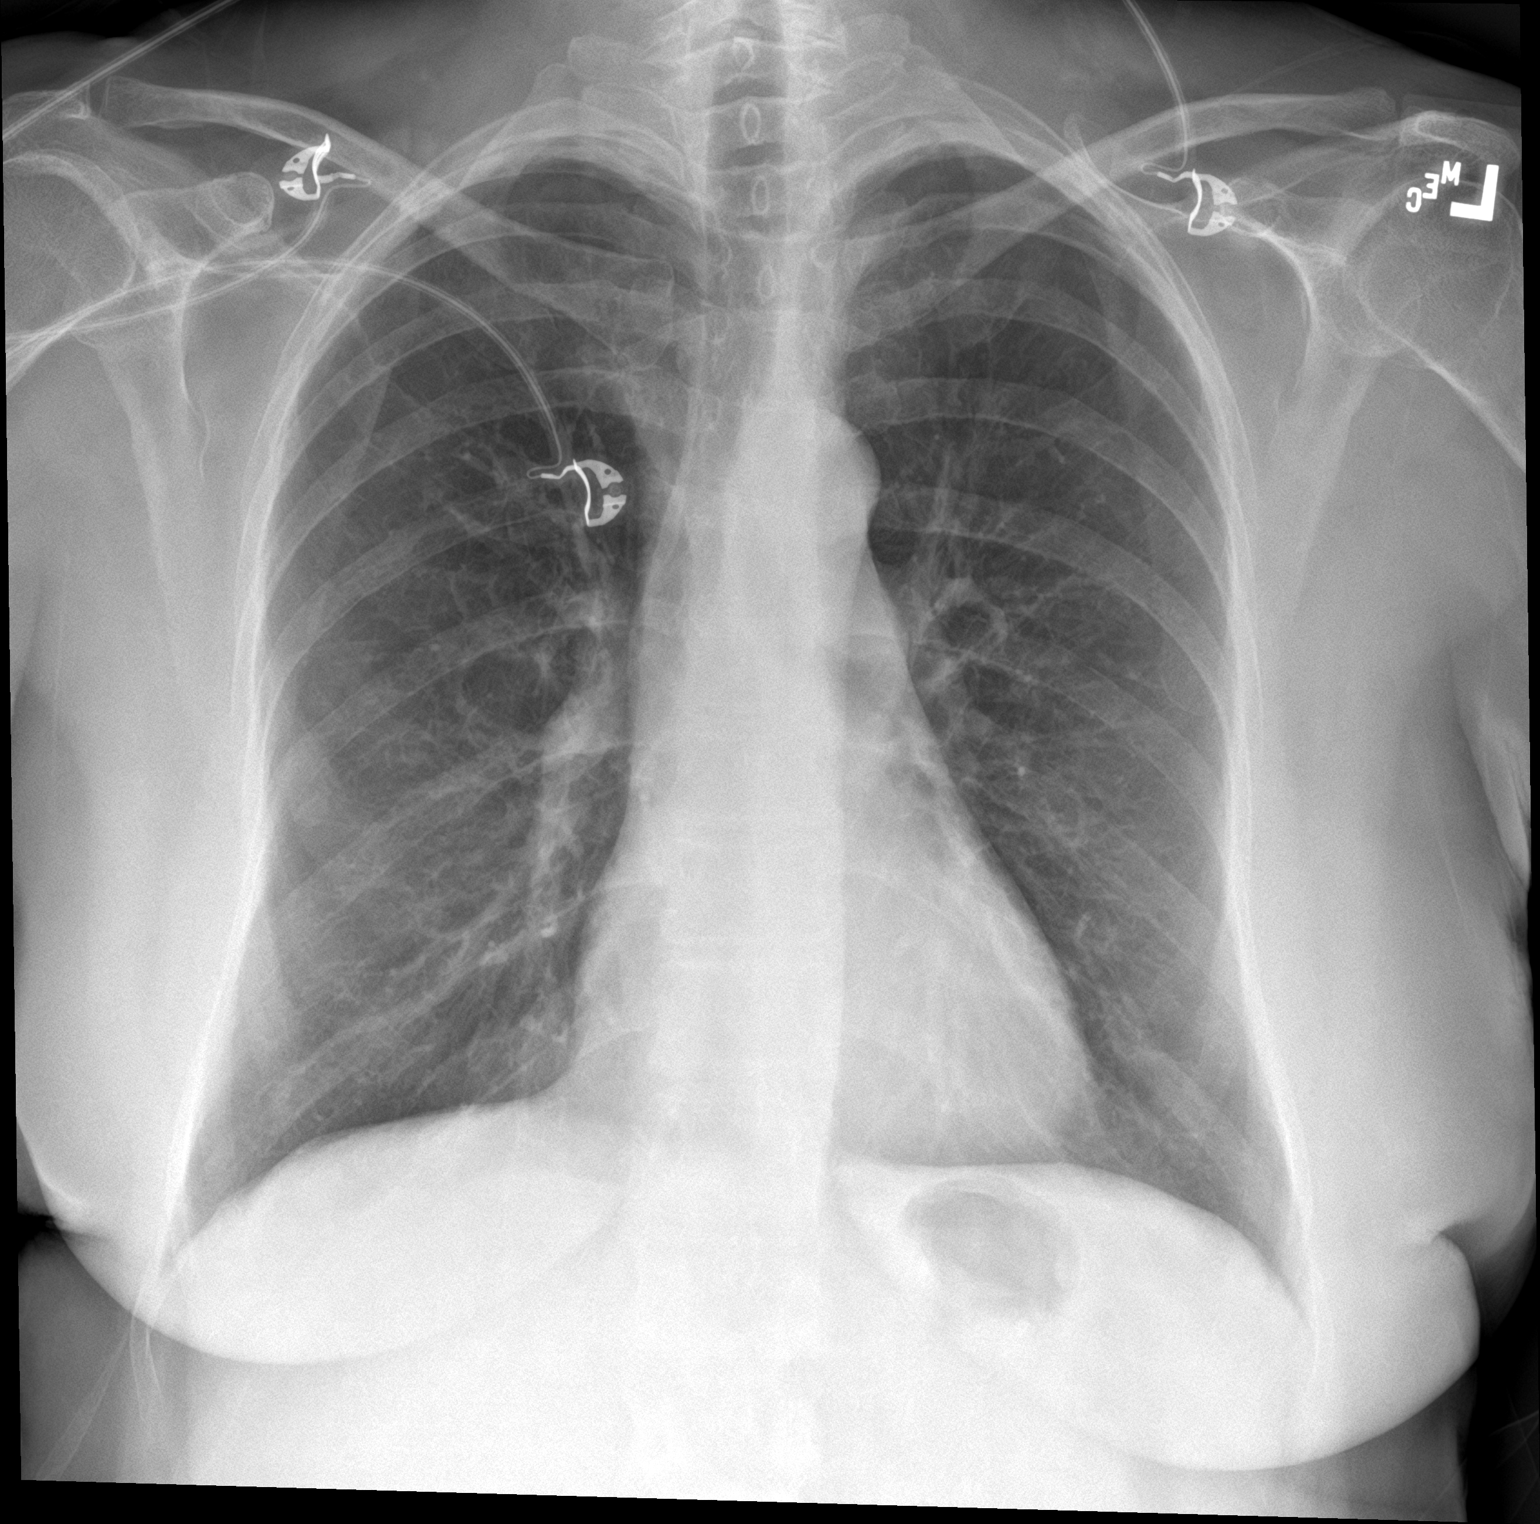

[chest lat]
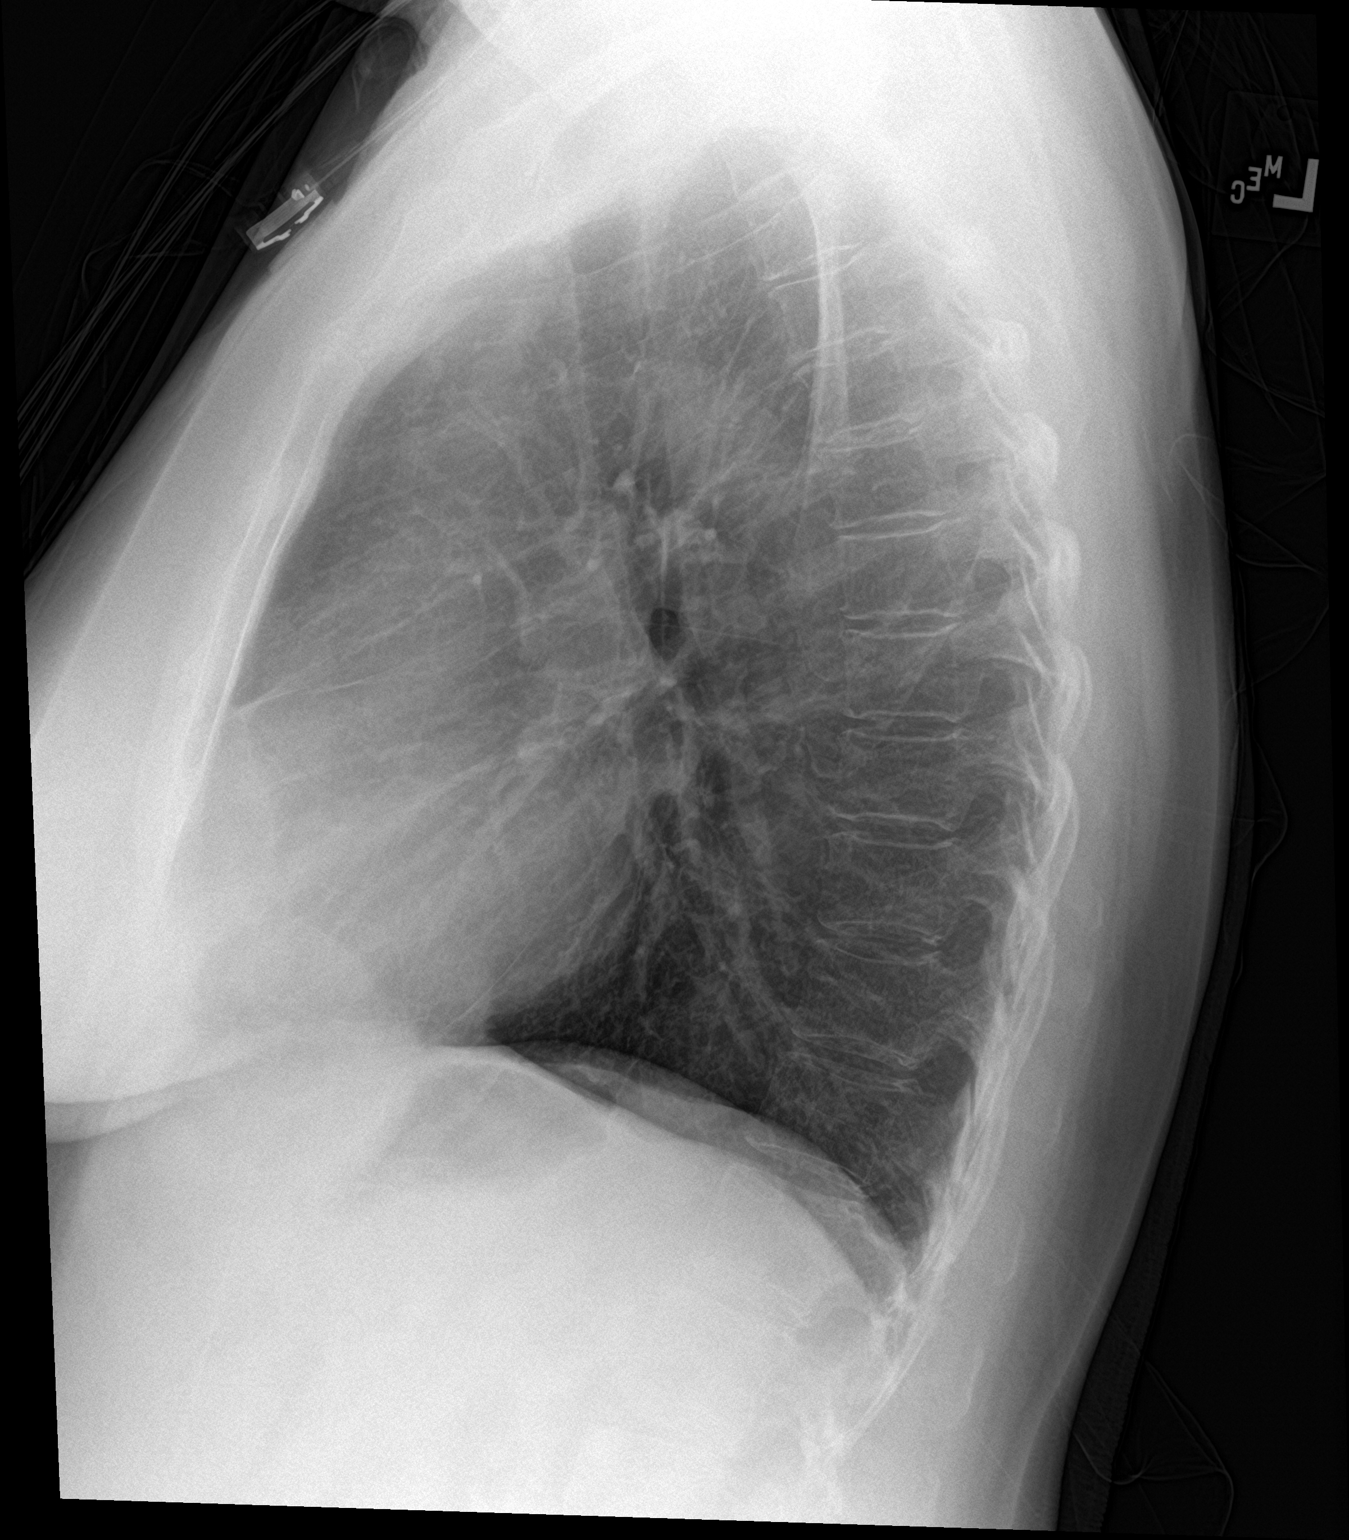

[2 of 2 positions shown; findings below may reference images not displayed]

FINDINGS: Lungs are clear. Heart size and pulmonary vascularity are normal. No
adenopathy. No pneumothorax. No bone lesions.
IMPRESSION: No edema or consolidation.

## 2020-06-16 ENCOUNTER — Other Ambulatory Visit: Payer: Self-pay

## 2020-06-16 ENCOUNTER — Ambulatory Visit (INDEPENDENT_AMBULATORY_CARE_PROVIDER_SITE_OTHER): Payer: Medicare Other | Admitting: Dermatology

## 2020-06-16 DIAGNOSIS — Z1283 Encounter for screening for malignant neoplasm of skin: Secondary | ICD-10-CM

## 2020-06-16 DIAGNOSIS — L578 Other skin changes due to chronic exposure to nonionizing radiation: Secondary | ICD-10-CM | POA: Diagnosis not present

## 2020-06-16 DIAGNOSIS — L821 Other seborrheic keratosis: Secondary | ICD-10-CM

## 2020-06-16 DIAGNOSIS — L918 Other hypertrophic disorders of the skin: Secondary | ICD-10-CM

## 2020-06-16 DIAGNOSIS — L814 Other melanin hyperpigmentation: Secondary | ICD-10-CM

## 2020-06-16 DIAGNOSIS — L820 Inflamed seborrheic keratosis: Secondary | ICD-10-CM | POA: Diagnosis not present

## 2020-06-16 DIAGNOSIS — Q825 Congenital non-neoplastic nevus: Secondary | ICD-10-CM | POA: Diagnosis not present

## 2020-06-16 DIAGNOSIS — L853 Xerosis cutis: Secondary | ICD-10-CM

## 2020-06-16 DIAGNOSIS — D1800 Hemangioma unspecified site: Secondary | ICD-10-CM

## 2020-06-16 DIAGNOSIS — D229 Melanocytic nevi, unspecified: Secondary | ICD-10-CM

## 2020-06-16 NOTE — Patient Instructions (Signed)

## 2020-06-16 NOTE — Progress Notes (Signed)
Follow-Up Visit   Subjective  Karla Martin is a 70 y.o. female who presents for the following: Annual Exam The patient presents for Total-Body Skin Exam (TBSE) for skin cancer screening and mole check. Complains of irritating spots of forehead.  The following portions of the chart were reviewed this encounter and updated as appropriate:   Tobacco  Allergies  Meds  Problems  Med Hx  Surg Hx  Fam Hx     Review of Systems:  No other skin or systemic complaints except as noted in HPI or Assessment and Plan.  Objective  Well appearing patient in no apparent distress; mood and affect are within normal limits.  A full examination was performed including scalp, head, eyes, ears, nose, lips, neck, chest, axillae, abdomen, back, buttocks, bilateral upper extremities, bilateral lower extremities, hands, feet, fingers, toes, fingernails, and toenails. All findings within normal limits unless otherwise noted below.  Objective  Forehead x 1: Erythematous keratotic or waxy stuck-on papule or plaque.   Objective  R thigh: Brown macule.  Assessment & Plan  Inflamed seborrheic keratosis Forehead x 1  Destruction of lesion - Forehead x 1 Complexity: simple   Destruction method: cryotherapy   Informed consent: discussed and consent obtained   Timeout:  patient name, date of birth, surgical site, and procedure verified Lesion destroyed using liquid nitrogen: Yes   Region frozen until ice ball extended beyond lesion: Yes   Outcome: patient tolerated procedure well with no complications   Post-procedure details: wound care instructions given    Congenital nevus R thigh  Benign-appearing.  Observation.  Call clinic for new or changing lesions.  Recommend daily use of broad spectrum spf 30+ sunscreen to sun-exposed areas.    Skin cancer screening   Lentigines - Scattered tan macules - Due to sun exposure - Benign-appering, observe - Recommend daily broad spectrum sunscreen SPF  30+ to sun-exposed areas, reapply every 2 hours as needed. - Call for any changes  Seborrheic Keratoses - Stuck-on, waxy, tan-brown papules and/or plaques  - Benign-appearing - Discussed benign etiology and prognosis. - Observe - Call for any changes  Melanocytic Nevi - Tan-brown and/or pink-flesh-colored symmetric macules and papules - Benign appearing on exam today - Observation - Call clinic for new or changing moles - Recommend daily use of broad spectrum spf 30+ sunscreen to sun-exposed areas.   Hemangiomas - Red papules - Discussed benign nature - Observe - Call for any changes  Actinic Damage - Chronic condition, secondary to cumulative UV/sun exposure - diffuse scaly erythematous macules with underlying dyspigmentation - Recommend daily broad spectrum sunscreen SPF 30+ to sun-exposed areas, reapply every 2 hours as needed.  - Staying in the shade or wearing long sleeves, sun glasses (UVA+UVB protection) and wide brim hats (4-inch brim around the entire circumference of the hat) are also recommended for sun protection.  - Call for new or changing lesions.  Acrochordons (Skin Tags) - Fleshy, skin-colored pedunculated papules - Benign appearing.  - Observe. - If desired, they can be removed with an in office procedure that is not covered by insurance. - Please call the clinic if you notice any new or changing lesions.  Xerosis - diffuse xerotic patches - recommend gentle, hydrating skin care - gentle skin care handout given  Skin cancer screening performed today.  Return in about 1 year (around 06/16/2021) for TBSE.  Luther Redo, CMA, am acting as scribe for Sarina Ser, MD .  Documentation: I have reviewed the above documentation  for accuracy and completeness, and I agree with the above.  Sarina Ser, MD

## 2020-06-21 ENCOUNTER — Encounter: Payer: Self-pay | Admitting: Dermatology

## 2020-11-23 ENCOUNTER — Ambulatory Visit
Admission: EM | Admit: 2020-11-23 | Discharge: 2020-11-23 | Disposition: A | Discharge status: Home / Self Care | Payer: Medicare Other | Attending: Emergency Medicine | Admitting: Emergency Medicine

## 2020-11-23 ENCOUNTER — Encounter: Payer: Self-pay | Admitting: Emergency Medicine

## 2020-11-23 ENCOUNTER — Other Ambulatory Visit: Payer: Self-pay

## 2020-11-23 DIAGNOSIS — B9689 Other specified bacterial agents as the cause of diseases classified elsewhere: Secondary | ICD-10-CM

## 2020-11-23 DIAGNOSIS — L089 Local infection of the skin and subcutaneous tissue, unspecified: Secondary | ICD-10-CM | POA: Diagnosis not present

## 2020-11-23 MED ORDER — DOXYCYCLINE HYCLATE 100 MG PO CAPS: 100.0000 mg | ORAL_CAPSULE | Freq: Two times a day (BID) | ORAL | 0 refills | Status: DC

## 2020-11-23 NOTE — Discharge Instructions (Addendum)
Take antibiotic (doxycycline) with food as prescribed. Keep covered until wound closes. Keep area covered with petroleum jelly or triple antibiotic ointment to help promote wound healing.  Watch for signs of worsening infection: increased redness, swelling, red streaking, fever, or worsening pain. If these symptoms are present, or if you have new or concerning symptoms, return to clinic immediately for a recheck.

## 2020-11-23 NOTE — ED Provider Notes (Signed)
Chief Complaint   Chief Complaint  Patient presents with   Wound Check     Subjective, HPI  Karla Martin is a 70 y.o. female who presents with right lower extremity cut which occurred 10 days ago after she caught her lower leg on the dishwasher.  Patient reports that her scab is intact, but is concerned due to increasing redness and swelling.  No pain reported.  No fever or chills.  History obtained from patient.  Patient's problem list, past medical and social history, medications, and allergies were reviewed by me and updated in Epic.    ROS  See HPI.  Objective   Vitals:   11/23/20 1648  BP: (!) 163/87  Pulse: 72  Resp: 18  Temp: 98.2 F (36.8 C)  SpO2: 98%    Vital signs and nursing note reviewed.   General: Appears well-developed and well-nourished. No acute distress.  Head: Normocephalic and atraumatic.   Neck: Normal range of motion, neck is supple.  Cardiovascular: Normal rate. Pulm/Chest: No respiratory distress.  Musculoskeletal: Right lower extremity: Mild swelling noted about right lower extremity with scab and surrounding erythema to lateral aspect of right lower extremity measuring approximately 2 cm x 2 cm of erythema with a central scab.  Area is erythematous.  5/5 strength, full sensation. Neurological: Alert and oriented to person, place, and time.  Skin: Skin is warm and dry.   Psychiatric: Normal mood, affect, behavior, and thought content.    Data  No results found for any visits on 11/23/20.    Assessment & Plan  1. Localized bacterial skin infection - doxycycline (VIBRAMYCIN) 100 MG capsule; Take 1 capsule (100 mg total) by mouth 2 (two) times daily.  Dispense: 20 capsule; Refill: 0  70 y.o. female presents with right lower extremity cut which occurred 10 days ago after she caught her lower leg on the dishwasher.  Patient reports that her scab is intact, but is concerned due to increasing redness and swelling.  No pain reported.  No fever or  chills.  Given symptoms along with assessment findings, likely infection of skin to right lower extremity secondary to injury.  Rx'd doxycycline to the patient's preferred pharmacy twice daily for the next 10 days.  Advised to keep the area clean.  Also advised to cover the area with triple antibiotic ointment or petroleum jelly to help keep the wound moist to promote wound healing.  Return for any fever, chills or worsening symptoms for which will need further investigation.  Patient verbalized understanding and agreed with plan.  Patient stable upon discharge.  Return as needed.  Plan:   Discharge Instructions      Take antibiotic (doxycycline) with food as prescribed. Keep covered until wound closes. Keep area covered with petroleum jelly or triple antibiotic ointment to help promote wound healing.  Watch for signs of worsening infection: increased redness, swelling, red streaking, fever, or worsening pain. If these symptoms are present, or if you have new or concerning symptoms, return to clinic immediately for a recheck.          Serafina Royals, [place] 11/23/20 1724

## 2020-11-23 NOTE — ED Triage Notes (Signed)
Cut her right lower leg on the edge of dish washer. This occurred 10 days ago.  Scab intact, redness around wound.  No pain, slight swelling

## 2021-06-21 ENCOUNTER — Ambulatory Visit (INDEPENDENT_AMBULATORY_CARE_PROVIDER_SITE_OTHER): Payer: Medicare Other | Admitting: Dermatology

## 2021-06-21 DIAGNOSIS — Z1283 Encounter for screening for malignant neoplasm of skin: Secondary | ICD-10-CM | POA: Diagnosis not present

## 2021-06-21 DIAGNOSIS — D1800 Hemangioma unspecified site: Secondary | ICD-10-CM

## 2021-06-21 DIAGNOSIS — D229 Melanocytic nevi, unspecified: Secondary | ICD-10-CM

## 2021-06-21 DIAGNOSIS — L820 Inflamed seborrheic keratosis: Secondary | ICD-10-CM | POA: Diagnosis not present

## 2021-06-21 DIAGNOSIS — L814 Other melanin hyperpigmentation: Secondary | ICD-10-CM | POA: Diagnosis not present

## 2021-06-21 DIAGNOSIS — L578 Other skin changes due to chronic exposure to nonionizing radiation: Secondary | ICD-10-CM

## 2021-06-21 DIAGNOSIS — L821 Other seborrheic keratosis: Secondary | ICD-10-CM | POA: Diagnosis not present

## 2021-06-21 NOTE — Progress Notes (Signed)
? ?  Follow-Up Visit ?  ?Subjective  ?Karla Martin is a 71 y.o. female who presents for the following: Annual Exam (Mole check ). ?The patient presents for Total-Body Skin Exam (TBSE) for skin cancer screening and mole check.  The patient has spots, moles and lesions to be evaluated, some may be new or changing and the patient has concerns that these could be cancer.  ? ?The following portions of the chart were reviewed this encounter and updated as appropriate:  ? Tobacco  Allergies  Meds  Problems  Med Hx  Surg Hx  Fam Hx   ?  ?Review of Systems:  No other skin or systemic complaints except as noted in HPI or Assessment and Plan. ? ?Objective  ?Well appearing patient in no apparent distress; mood and affect are within normal limits. ? ?A full examination was performed including scalp, head, eyes, ears, nose, lips, neck, chest, axillae, abdomen, back, buttocks, bilateral upper extremities, bilateral lower extremities, hands, feet, fingers, toes, fingernails, and toenails. All findings within normal limits unless otherwise noted below. ? ?left forehead, right neck, anterior crown scalp, right prox post thigh, intramammary area, left ankle   (6) (6) ?Stuck-on, waxy, tan-brown papules ? ? ?Assessment & Plan  ?Inflamed seborrheic keratosis (6) ?left forehead, right neck, anterior crown scalp, right prox post thigh, intramammary area, left ankle   (6) ? ?Destruction of lesion - left forehead, right neck, anterior crown scalp, right prox post thigh, intramammary area, left ankle   (6) ?Complexity: simple   ?Destruction method: cryotherapy   ?Informed consent: discussed and consent obtained   ?Timeout:  patient name, date of birth, surgical site, and procedure verified ?Lesion destroyed using liquid nitrogen: Yes   ?Region frozen until ice ball extended beyond lesion: Yes   ?Outcome: patient tolerated procedure well with no complications   ?Post-procedure details: wound care instructions given   ? ?Skin cancer  screening ? ?Lentigines ?- Scattered tan macules ?- Due to sun exposure ?- Benign-appearing, observe ?- Recommend daily broad spectrum sunscreen SPF 30+ to sun-exposed areas, reapply every 2 hours as needed. ?- Call for any changes ? ?Seborrheic Keratoses ?- Stuck-on, waxy, tan-brown papules and/or plaques  ?- Benign-appearing ?- Discussed benign etiology and prognosis. ?- Observe ?- Call for any changes ? ?Melanocytic Nevi ?- Tan-brown and/or pink-flesh-colored symmetric macules and papules ?- Benign appearing on exam today ?- Observation ?- Call clinic for new or changing moles ?- Recommend daily use of broad spectrum spf 30+ sunscreen to sun-exposed areas.  ? ?Hemangiomas ?- Red papules ?- Discussed benign nature ?- Observe ?- Call for any changes ? ?Actinic Damage ?- Chronic condition, secondary to cumulative UV/sun exposure ?- diffuse scaly erythematous macules with underlying dyspigmentation ?- Recommend daily broad spectrum sunscreen SPF 30+ to sun-exposed areas, reapply every 2 hours as needed.  ?- Staying in the shade or wearing long sleeves, sun glasses (UVA+UVB protection) and wide brim hats (4-inch brim around the entire circumference of the hat) are also recommended for sun protection.  ?- Call for new or changing lesions. ? ?Skin cancer screening performed today.  ? ?Return in about 1 year (around 06/22/2022) for TBSE, hx of ISKs. ? ?I, Karla Martin, CMA, am acting as scribe for Sarina Ser, MD .  ?Documentation: I have reviewed the above documentation for accuracy and completeness, and I agree with the above. ? ?Sarina Ser, MD ? ?

## 2021-06-21 NOTE — Patient Instructions (Addendum)

## 2021-06-30 ENCOUNTER — Encounter: Payer: Self-pay | Admitting: Dermatology

## 2021-08-17 ENCOUNTER — Encounter: Payer: Self-pay | Admitting: Emergency Medicine

## 2021-08-17 ENCOUNTER — Ambulatory Visit
Admission: EM | Admit: 2021-08-17 | Discharge: 2021-08-17 | Disposition: A | Discharge status: Home / Self Care | Payer: Medicare Other | Attending: Family Medicine | Admitting: Family Medicine

## 2021-08-17 DIAGNOSIS — N3001 Acute cystitis with hematuria: Secondary | ICD-10-CM | POA: Insufficient documentation

## 2021-08-17 DIAGNOSIS — R300 Dysuria: Secondary | ICD-10-CM

## 2021-08-17 LAB — POCT URINALYSIS DIP (MANUAL ENTRY)
Bilirubin, UA: NEGATIVE
Glucose, UA: NEGATIVE mg/dL
Ketones, POC UA: NEGATIVE mg/dL
Nitrite, UA: NEGATIVE
Protein Ur, POC: NEGATIVE mg/dL
Spec Grav, UA: <=1.005 — AB (ref 1.010–1.025)
Urobilinogen, UA: 0.2 E.U./dL
pH, UA: 6 (ref 5.0–8.0)

## 2021-08-17 MED ORDER — DOXYCYCLINE HYCLATE 100 MG PO CAPS: 100.0000 mg | ORAL_CAPSULE | Freq: Two times a day (BID) | ORAL | 0 refills | Status: AC

## 2021-08-17 NOTE — ED Triage Notes (Signed)
Pt presents with urinary frequency and discomfort while urinating since yesterday.

## 2021-08-17 NOTE — ED Provider Notes (Signed)
Roderic Palau    CSN: [id number] Arrival date & time: 08/17/21  0955      History   Chief Complaint Chief Complaint  Patient presents with   Urinary Frequency    HPI Karla Martin is a 71 y.o. female.   HPI Karla Martin is a 71 y.o. female presents for evaluation of urinary frequency, urgency and dysuria x 2 days, without flank pain, fever, chills, or abnormal vaginal discharge or bleeding. History of UTI x 5 years ago although unknown urine pathology. Uncertain of prior urine pathology. No LMP recorded. Patient is postmenopausal.  Past Medical History:  Diagnosis Date   GERD (gastroesophageal reflux disease)    Hypercholesteremia    Hypertension     Patient Active Problem List   Diagnosis Date Noted   Toe fracture 05/26/2015    Past Surgical History:  Procedure Laterality Date   BREAST SURGERY     fibroid tumor   COLONOSCOPY WITH PROPOFOL N/A 01/19/2019   Procedure: COLONOSCOPY WITH PROPOFOL;  Surgeon: Toledo, Benay Pike, MD;  Location: [facility] ENDOSCOPY;  Service: Gastroenterology;  Laterality: N/A;   colonoscopy x3     UTERINE FIBROID SURGERY      OB History   No obstetric history on file.      Home Medications    Prior to Admission medications   Medication Sig Start Date End Date Taking? Authorizing Provider  amLODipine (NORVASC) 5 MG tablet Take 5 mg by mouth daily.   Yes [provider]  Ascorbic Acid (VITAMIN C) 1000 MG tablet Take by mouth.   Yes [provider]  calcium-vitamin D (OSCAL WITH D) 500-200 MG-UNIT tablet Take by mouth.   Yes [provider]  doxycycline (VIBRAMYCIN) 100 MG capsule Take 1 capsule (100 mg total) by mouth 2 (two) times daily for 3 days. 08/17/21 08/20/21 Yes Scot Jun, FNP  simvastatin (ZOCOR) 10 MG tablet Take 10 mg by mouth daily.   Yes [provider]  Vitamin D, Ergocalciferol, (DRISDOL) 50000 units CAPS capsule Take by mouth.   Yes [provider]    Family  History History reviewed. No pertinent family history.  Social History Social History   Tobacco Use   Smoking status: Never   Smokeless tobacco: Never  Vaping Use   Vaping Use: Never used  Substance Use Topics   Alcohol use: Yes    Alcohol/week: 0.0 standard drinks of alcohol   Drug use: Never     Allergies   Erythromycin and Penicillins   Review of Systems Review of Systems Pertinent negatives listed in HPI   Physical Exam Triage Vital Signs ED Triage Vitals  Enc Vitals Group     BP 08/17/21 1005 (!) 147/73     Pulse Rate 08/17/21 1005 68     Resp 08/17/21 1005 16     Temp 08/17/21 1005 98.3 F (36.8 C)     Temp Source 08/17/21 1005 Oral     SpO2 08/17/21 1005 98 %     Weight --      Height --      Head Circumference --      Peak Flow --      Pain Score 08/17/21 1004 0     Pain Loc --      Pain Edu? --      Excl. in [place]? --    No data found.  Updated Vital Signs BP (!) 147/73 (BP Location: Left Arm)   Pulse 68   Temp  98.3 F (36.8 C) (Oral)   Resp 16   SpO2 98%   Visual Acuity Right Eye Distance:   Left Eye Distance:   Bilateral Distance:    Right Eye Near:   Left Eye Near:    Bilateral Near:     Physical Exam General appearance: Alert, cooperative and in no distress Head: Normocephalic, without obvious abnormality, atraumatic Respiratory: Respirations even and unlabored, normal respiratory rate Heart: rate and rhythm normal.   CVA:  NO CVA tenderness on exam  Extremities: No gross deformities Skin: Skin color, texture, turgor normal. No rashes seen  Psych: Appropriate mood and affect.   UC Treatments / Results  Labs (all labs ordered are listed, but only abnormal results are displayed) Labs Reviewed  POCT URINALYSIS DIP (MANUAL ENTRY) - Abnormal; Notable for the following components:      Result Value   Spec Grav, UA <=1.005 (*)    Blood, UA large (*)    Leukocytes, UA Trace (*)    All other components within normal limits  URINE  CULTURE    EKG   Radiology No results found.  Procedures Procedures (including critical care time)  Medications Ordered in UC Medications - No data to display  Initial Impression / Assessment and Plan / UC Course  I have reviewed the triage vital signs and the nursing notes.  Pertinent labs & imaging results that were available during my care of the patient were reviewed by me and considered in my medical decision making (see chart for details).       UA abnormal and findings consistent with UTI.  Empiric antibiotic treatment initiated doxycycline BID x 3 day. May extend for an additional 2 days if needed once urine culture results. Encouraged increase intake of water.  ER if symptoms become severe. Follow-up with PCP if symptoms do not completely resolve.  Final Clinical Impressions(s) / UC Diagnoses   Final diagnoses:  Dysuria  Acute cystitis with hematuria     Discharge Instructions      Follow-up with your primary care doctor for further work-up if your symptoms do not improve or if your urine culture doesn't show an infection as microscopic amount of blood was seen when your urine was tested here in clinic. Continue to hydrate well with fluids.      ED Prescriptions     Medication Sig Dispense Auth. Provider   doxycycline (VIBRAMYCIN) 100 MG capsule Take 1 capsule (100 mg total) by mouth 2 (two) times daily for 3 days. 6 capsule Scot Jun, FNP      PDMP not reviewed this encounter.   Scot Jun, FNP 08/17/21 1036

## 2021-08-17 NOTE — Discharge Instructions (Addendum)
Follow-up with your primary care doctor for further work-up if your symptoms do not improve or if your urine culture doesn't show an infection as microscopic amount of blood was seen when your urine was tested here in clinic. Continue to hydrate well with fluids.

## 2021-08-19 LAB — URINE CULTURE
Culture: 60000 — AB
Special Requests: NORMAL

## 2022-04-05 ENCOUNTER — Other Ambulatory Visit: Payer: Self-pay | Admitting: Internal Medicine

## 2022-04-05 DIAGNOSIS — E782 Mixed hyperlipidemia: Secondary | ICD-10-CM

## 2022-04-05 MED ORDER — SIMVASTATIN 20 MG PO TABS: 20.0000 mg | ORAL_TABLET | Freq: Every day | ORAL | 0 refills | Status: DC

## 2022-04-05 MED ORDER — AMLODIPINE BESYLATE 5 MG PO TABS: 5.0000 mg | ORAL_TABLET | Freq: Every day | ORAL | 3 refills | Status: DC

## 2022-04-06 ENCOUNTER — Other Ambulatory Visit: Payer: Self-pay

## 2022-04-06 DIAGNOSIS — E782 Mixed hyperlipidemia: Secondary | ICD-10-CM

## 2022-04-06 MED ORDER — SIMVASTATIN 20 MG PO TABS: 20.0000 mg | ORAL_TABLET | Freq: Every day | ORAL | 0 refills | Status: DC

## 2022-05-05 ENCOUNTER — Encounter: Payer: Self-pay | Admitting: Internal Medicine

## 2022-05-08 ENCOUNTER — Other Ambulatory Visit: Payer: Self-pay

## 2022-05-08 DIAGNOSIS — R7302 Impaired glucose tolerance (oral): Secondary | ICD-10-CM

## 2022-05-08 DIAGNOSIS — E782 Mixed hyperlipidemia: Secondary | ICD-10-CM

## 2022-06-22 ENCOUNTER — Encounter: Payer: Self-pay | Admitting: Internal Medicine

## 2022-06-27 ENCOUNTER — Ambulatory Visit (INDEPENDENT_AMBULATORY_CARE_PROVIDER_SITE_OTHER): Payer: Medicare Other | Admitting: Dermatology

## 2022-06-27 VITALS — BP 159/76

## 2022-06-27 DIAGNOSIS — Z1283 Encounter for screening for malignant neoplasm of skin: Secondary | ICD-10-CM

## 2022-06-27 DIAGNOSIS — L820 Inflamed seborrheic keratosis: Secondary | ICD-10-CM | POA: Diagnosis not present

## 2022-06-27 DIAGNOSIS — D229 Melanocytic nevi, unspecified: Secondary | ICD-10-CM

## 2022-06-27 DIAGNOSIS — L821 Other seborrheic keratosis: Secondary | ICD-10-CM | POA: Diagnosis not present

## 2022-06-27 DIAGNOSIS — L578 Other skin changes due to chronic exposure to nonionizing radiation: Secondary | ICD-10-CM | POA: Diagnosis not present

## 2022-06-27 DIAGNOSIS — L814 Other melanin hyperpigmentation: Secondary | ICD-10-CM

## 2022-06-27 NOTE — Patient Instructions (Signed)
Cryotherapy Aftercare  Wash gently with soap and water everyday.   Apply Vaseline and Band-Aid daily until healed.     Due to recent changes in healthcare laws, you may see results of your pathology and/or laboratory studies on MyChart before the doctors have had a chance to review them. We understand that in some cases there may be results that are confusing or concerning to you. Please understand that not all results are received at the same time and often the doctors may need to interpret multiple results in order to provide you with the best plan of care or course of treatment. Therefore, we ask that you please give us 2 business days to thoroughly review all your results before contacting the office for clarification. Should we see a critical lab result, you will be contacted sooner.   If You Need Anything After Your Visit  If you have any questions or concerns for your doctor, please call our main line at [phone] and press option 4 to reach your doctor's medical assistant. If no one answers, please leave a voicemail as directed and we will return your call as soon as possible. Messages left after 4 pm will be answered the following business day.   You may also send us a message via MyChart. We typically respond to MyChart messages within 1-2 business days.  For prescription refills, please ask your pharmacy to contact our office. Our fax number is [phone].  If you have an urgent issue when the clinic is closed that cannot wait until the next business day, you can page your doctor at the number below.    Please note that while we do our best to be available for urgent issues outside of office hours, we are not available 24/7.   If you have an urgent issue and are unable to reach us, you may choose to seek medical care at your doctor's office, retail clinic, urgent care center, or emergency room.  If you have a medical emergency, please immediately call 911 or go to the  emergency department.  Pager Numbers  - Dr. Kowalski: [phone]  - Dr. Moye: [phone]  - Dr. Stewart: [phone]  In the event of inclement weather, please call our main line at [phone] for an update on the status of any delays or closures.  Dermatology Medication Tips: Please keep the boxes that topical medications come in in order to help keep track of the instructions about where and how to use these. Pharmacies typically print the medication instructions only on the boxes and not directly on the medication tubes.   If your medication is too expensive, please contact our office at [phone] option 4 or send us a message through MyChart.   We are unable to tell what your co-pay for medications will be in advance as this is different depending on your insurance coverage. However, we may be able to find a substitute medication at lower cost or fill out paperwork to get insurance to cover a needed medication.   If a prior authorization is required to get your medication covered by your insurance company, please allow us 1-2 business days to complete this process.  Drug prices often vary depending on where the prescription is filled and some pharmacies may offer cheaper prices.  The website www.goodrx.com contains coupons for medications through different pharmacies. The prices here do not account for what the cost may be with help from insurance (it may be cheaper with your insurance), but the website can   give you the price if you did not use any insurance.  - You can print the associated coupon and take it with your prescription to the pharmacy.  - You may also stop by our office during regular business hours and pick up a GoodRx coupon card.  - If you need your prescription sent electronically to a different pharmacy, notify our office through [place] MyChart or by phone at [phone] option 4.     Si Usted Necesita Algo Despus de Su Visita  Tambin puede  enviarnos un mensaje a travs de MyChart. Por lo general respondemos a los mensajes de MyChart en el transcurso de 1 a 2 das hbiles.  Para renovar recetas, por favor pida a su farmacia que se ponga en contacto con nuestra oficina. Nuestro nmero de fax es el [phone].  Si tiene un asunto urgente cuando la clnica est cerrada y que no puede esperar hasta el siguiente da hbil, puede llamar/localizar a su doctor(a) al nmero que aparece a continuacin.   Por favor, tenga en cuenta que aunque hacemos todo lo posible para estar disponibles para asuntos urgentes fuera del horario de oficina, no estamos disponibles las 24 horas del da, los 7 das de la semana.   Si tiene un problema urgente y no puede comunicarse con nosotros, puede optar por buscar atencin mdica  en el consultorio de su doctor(a), en una clnica privada, en un centro de atencin urgente o en una sala de emergencias.  Si tiene una emergencia mdica, por favor llame inmediatamente al 911 o vaya a la sala de emergencias.  Nmeros de bper  - Dr. Kowalski: [phone]  - Dra. Moye: [phone]  - Dra. Stewart: [phone]  En caso de inclemencias del tiempo, por favor llame a nuestra lnea principal al [phone] para una actualizacin sobre el estado de cualquier retraso o cierre.  Consejos para la medicacin en dermatologa: Por favor, guarde las cajas en las que vienen los medicamentos de uso tpico para ayudarle a seguir las instrucciones sobre dnde y cmo usarlos. Las farmacias generalmente imprimen las instrucciones del medicamento slo en las cajas y no directamente en los tubos del medicamento.   Si su medicamento es muy caro, por favor, pngase en contacto con nuestra oficina llamando al [phone] y presione la opcin 4 o envenos un mensaje a travs de MyChart.   No podemos decirle cul ser su copago por los medicamentos por adelantado ya que esto es diferente dependiendo de la cobertura de su seguro.  Sin embargo, es posible que podamos encontrar un medicamento sustituto a menor costo o llenar un formulario para que el seguro cubra el medicamento que se considera necesario.   Si se requiere una autorizacin previa para que su compaa de seguros cubra su medicamento, por favor permtanos de 1 a 2 das hbiles para completar este proceso.  Los precios de los medicamentos varan con frecuencia dependiendo del lugar de dnde se surte la receta y alguna farmacias pueden ofrecer precios ms baratos.  El sitio web www.goodrx.com tiene cupones para medicamentos de diferentes farmacias. Los precios aqu no tienen en cuenta lo que podra costar con la ayuda del seguro (puede ser ms barato con su seguro), pero el sitio web puede darle el precio si no utiliz ningn seguro.  - Puede imprimir el cupn correspondiente y llevarlo con su receta a la farmacia.  - Tambin puede pasar por nuestra oficina durante el horario de atencin regular y recoger una tarjeta de cupones de GoodRx.  -   Si necesita que su receta se enve electrnicamente a una farmacia diferente, informe a nuestra oficina a travs de MyChart de [place] o por telfono llamando al [phone] y presione la opcin 4.  

## 2022-06-27 NOTE — Progress Notes (Signed)
   Follow-Up Visit   Subjective  Karla Martin is a 72 y.o. female who presents for the following: Skin Cancer Screening and Full Body Skin Exam - No history of skin cancer or abnormal moles  The patient presents for Total-Body Skin Exam (TBSE) for skin cancer screening and mole check. The patient has spots, moles and lesions to be evaluated, some may be new or changing and the patient has concerns that these could be cancer.    The following portions of the chart were reviewed this encounter and updated as appropriate: medications, allergies, medical history  Review of Systems:  No other skin or systemic complaints except as noted in HPI or Assessment and Plan.  Objective  Well appearing patient in no apparent distress; mood and affect are within normal limits.  A full examination was performed including scalp, head, eyes, ears, nose, lips, neck, chest, axillae, abdomen, back, buttocks, bilateral upper extremities, bilateral lower extremities, hands, feet, fingers, toes, fingernails, and toenails. All findings within normal limits unless otherwise noted below.   Relevant physical exam findings are noted in the Assessment and Plan.  Right lat waistline x 1, left vertex scalp x 1 (2) Erythematous stuck-on, waxy papule or plaque    Assessment & Plan    LENTIGINES, SEBORRHEIC KERATOSES, HEMANGIOMAS - Benign normal skin lesions - Benign-appearing - Call for any changes  MELANOCYTIC NEVI - Tan-brown and/or pink-flesh-colored symmetric macules and papules - Benign appearing on exam today - Observation - Call clinic for new or changing moles - Recommend daily use of broad spectrum spf 30+ sunscreen to sun-exposed areas.   ACTINIC DAMAGE - Chronic condition, secondary to cumulative UV/sun exposure - diffuse scaly erythematous macules with underlying dyspigmentation - Recommend daily broad spectrum sunscreen SPF 30+ to sun-exposed areas, reapply every 2 hours as needed.  - Staying  in the shade or wearing long sleeves, sun glasses (UVA+UVB protection) and wide brim hats (4-inch brim around the entire circumference of the hat) are also recommended for sun protection.  - Call for new or changing lesions.  SKIN CANCER SCREENING PERFORMED TODAY.    Inflamed seborrheic keratosis (2) Right lat waistline x 1, left vertex scalp x 1  Destruction of lesion - Right lat waistline x 1, left vertex scalp x 1 Complexity: simple   Destruction method: cryotherapy   Informed consent: discussed and consent obtained   Timeout:  patient name, date of birth, surgical site, and procedure verified Lesion destroyed using liquid nitrogen: Yes   Region frozen until ice ball extended beyond lesion: Yes   Outcome: patient tolerated procedure well with no complications   Post-procedure details: wound care instructions given     Return in about 1 year (around 06/27/2023) for TBSE.  I, Joanie Coddington, CMA, am acting as scribe for Armida Sans, MD .   Documentation: I have reviewed the above documentation for accuracy and completeness, and I agree with the above.  Armida Sans, MD

## 2022-06-28 ENCOUNTER — Ambulatory Visit (INDEPENDENT_AMBULATORY_CARE_PROVIDER_SITE_OTHER): Payer: Medicare Other | Admitting: Internal Medicine

## 2022-06-28 ENCOUNTER — Encounter: Payer: Self-pay | Admitting: Internal Medicine

## 2022-06-28 VITALS — BP 130/68 | HR 79 | Ht 64.0 in | Wt 155.0 lb

## 2022-06-28 DIAGNOSIS — J301 Allergic rhinitis due to pollen: Secondary | ICD-10-CM | POA: Diagnosis not present

## 2022-06-28 DIAGNOSIS — E782 Mixed hyperlipidemia: Secondary | ICD-10-CM | POA: Diagnosis not present

## 2022-06-28 DIAGNOSIS — I10 Essential (primary) hypertension: Secondary | ICD-10-CM | POA: Diagnosis not present

## 2022-06-28 DIAGNOSIS — J069 Acute upper respiratory infection, unspecified: Secondary | ICD-10-CM

## 2022-06-28 MED ORDER — FEXOFENADINE HCL 180 MG PO TABS: 180.0000 mg | ORAL_TABLET | Freq: Every day | ORAL | 1 refills | Status: DC

## 2022-06-28 MED ORDER — DOXYCYCLINE HYCLATE 100 MG PO TABS: 100.0000 mg | ORAL_TABLET | Freq: Two times a day (BID) | ORAL | 0 refills | Status: DC

## 2022-06-28 MED ORDER — FLUTICASONE PROPIONATE 50 MCG/ACT NA SUSP: 1.0000 | Freq: Every day | NASAL | 2 refills | Status: DC

## 2022-06-28 MED ORDER — DOXYCYCLINE HYCLATE 100 MG PO TABS: 100.0000 mg | ORAL_TABLET | Freq: Two times a day (BID) | ORAL | Status: DC

## 2022-06-28 NOTE — Progress Notes (Signed)
Established Patient Office Visit  Subjective:  Patient ID: Karla Martin, female    DOB: 1950-08-23  Age: 72 y.o. MRN: [id number]  Chief Complaint  Patient presents with   Follow-up    congestion    Patient comes in with complaints of URI-like symptoms which started several days ago.  Initially it was sore throat with nasal and sinus congestion.  But then it got settled in her chest and now she has chest congestion, cough, productive of sputum.  Patient reports of some chills but no fever.  No body aches, no nausea or vomiting.  Home COVID test was negative. On exam both the tympanic membranes are opaque with some fluid behind them.    No other concerns at this time.   Past Medical History:  Diagnosis Date   GERD (gastroesophageal reflux disease)    Hypercholesteremia    Hypertension     Past Surgical History:  Procedure Laterality Date   BREAST SURGERY     fibroid tumor   COLONOSCOPY WITH PROPOFOL N/A 01/19/2019   Procedure: COLONOSCOPY WITH PROPOFOL;  Surgeon: Toledo, Boykin Nearing, MD;  Location: [facility] ENDOSCOPY;  Service: Gastroenterology;  Laterality: N/A;   colonoscopy x3     UTERINE FIBROID SURGERY      Social History   Socioeconomic History   Marital status: Single    Spouse name: Not on file   Number of children: Not on file   Years of education: Not on file   Highest education level: Not on file  Occupational History   Not on file  Tobacco Use   Smoking status: Never   Smokeless tobacco: Never  Vaping Use   Vaping Use: Never used  Substance and Sexual Activity   Alcohol use: Yes    Alcohol/week: 0.0 standard drinks of alcohol   Drug use: Never   Sexual activity: Yes  Other Topics Concern   Not on file  Social History Narrative   Not on file   Social Determinants of Health   Financial Resource Strain: Not on file  Food Insecurity: Not on file  Transportation Needs: Not on file  Physical Activity: Not on file  Stress: Not on file  Social  Connections: Not on file  Intimate Partner Violence: Not on file    History reviewed. No pertinent family history.  Allergies  Allergen Reactions   Erythromycin    Penicillins     Review of Systems  Constitutional:  Positive for chills and malaise/fatigue. Negative for diaphoresis, fever and weight loss.  HENT:  Positive for congestion, ear pain and sore throat. Negative for ear discharge, hearing loss and sinus pain.   Eyes:  Positive for discharge. Negative for blurred vision, double vision and photophobia.  Respiratory:  Positive for cough and sputum production. Negative for hemoptysis, shortness of breath and wheezing.   Cardiovascular:  Negative for chest pain, palpitations, leg swelling and PND.  Gastrointestinal:  Negative for abdominal pain, diarrhea, heartburn, nausea and vomiting.  Genitourinary: Negative.   Musculoskeletal:  Negative for back pain, joint pain, myalgias and neck pain.  Skin: Negative.   Neurological:  Positive for headaches. Negative for dizziness, tingling and speech change.  Psychiatric/Behavioral:  Negative for depression. The patient is not nervous/anxious.        Objective:   BP 130/68   Pulse 79   Ht  (1.626 m)   Wt 155 lb (70.3 kg)   SpO2 97%   BMI 26.61 kg/m   Vitals:   06/28/22 1000  BP: 130/68  Pulse: 79  Height:  (1.626 m)  Weight: 155 lb (70.3 kg)  SpO2: 97%  BMI (Calculated): 26.59    Physical Exam Vitals and nursing note reviewed.  Constitutional:      Appearance: Normal appearance.  HENT:     Right Ear: A middle ear effusion is present. Tympanic membrane is injected.     Left Ear: A middle ear effusion is present. Tympanic membrane is injected.     Nose: Congestion present.     Mouth/Throat:     Mouth: Mucous membranes are dry.  Cardiovascular:     Rate and Rhythm: Normal rate and regular rhythm.     Pulses: Normal pulses.     Heart sounds: Normal heart sounds.  Pulmonary:     Effort: Pulmonary effort  is normal.     Breath sounds: Normal breath sounds. No wheezing, rhonchi or rales.  Chest:     Chest wall: No tenderness.  Abdominal:     General: There is no distension.     Palpations: Abdomen is soft.     Tenderness: There is no abdominal tenderness. There is no guarding.  Musculoskeletal:        General: Normal range of motion.     Cervical back: Normal range of motion.  Skin:    General: Skin is warm and dry.  Neurological:     General: No focal deficit present.     Mental Status: She is alert and oriented to person, place, and time.  Psychiatric:        Mood and Affect: Mood normal.        Behavior: Behavior normal.      No results found for any visits on 06/28/22.  No results found for this or any previous visit (from the past 2160 hour(s)).    Assessment & Plan:  Patient advised rest fluids and Tylenol.  Will add doxycycline.  Patient will also pick up Flonase nasal spray and Allegra tablets.  Follow-up is as scheduled. Problem List Items Addressed This Visit     Seasonal allergic rhinitis due to pollen   Relevant Medications   fluticasone (FLONASE) 50 MCG/ACT nasal spray   fexofenadine (ALLEGRA) 180 MG tablet   Essential hypertension, benign   Mixed hyperlipidemia   Other Visit Diagnoses     URI with cough and congestion    -  Primary   Relevant Medications   doxycycline (VIBRA-TABS) 100 MG tablet       No follow-ups on file.   Total time spent: 25 minutes  Margaretann Loveless, MD  06/28/2022

## 2022-07-02 ENCOUNTER — Other Ambulatory Visit: Payer: Self-pay | Admitting: Internal Medicine

## 2022-07-02 DIAGNOSIS — E782 Mixed hyperlipidemia: Secondary | ICD-10-CM

## 2022-07-03 ENCOUNTER — Encounter: Payer: Self-pay | Admitting: Dermatology

## 2022-07-13 ENCOUNTER — Telehealth: Payer: Self-pay

## 2022-07-13 ENCOUNTER — Other Ambulatory Visit: Payer: Self-pay | Admitting: Internal Medicine

## 2022-07-13 DIAGNOSIS — R7302 Impaired glucose tolerance (oral): Secondary | ICD-10-CM

## 2022-07-13 DIAGNOSIS — E782 Mixed hyperlipidemia: Secondary | ICD-10-CM

## 2022-07-13 DIAGNOSIS — I10 Essential (primary) hypertension: Secondary | ICD-10-CM

## 2022-07-13 NOTE — Telephone Encounter (Signed)
Pt asked if we can put in future lab order before her next appt on 6/20. She said she will plan to go about 10 days before her appt, will you put in future lab order for pt to get labs around 6/10? Please advise

## 2022-08-10 ENCOUNTER — Ambulatory Visit
Admission: EM | Admit: 2022-08-10 | Discharge: 2022-08-10 | Disposition: A | Discharge status: Home / Self Care | Payer: Medicare Other

## 2022-08-10 ENCOUNTER — Telehealth: Payer: Self-pay | Admitting: Emergency Medicine

## 2022-08-10 ENCOUNTER — Ambulatory Visit: Payer: Medicare Other

## 2022-08-10 DIAGNOSIS — S51851A Open bite of right forearm, initial encounter: Secondary | ICD-10-CM | POA: Diagnosis not present

## 2022-08-10 DIAGNOSIS — L089 Local infection of the skin and subcutaneous tissue, unspecified: Secondary | ICD-10-CM | POA: Diagnosis not present

## 2022-08-10 DIAGNOSIS — W5501XA Bitten by cat, initial encounter: Secondary | ICD-10-CM

## 2022-08-10 MED ORDER — DOXYCYCLINE HYCLATE 100 MG PO CAPS: 100.0000 mg | ORAL_CAPSULE | Freq: Two times a day (BID) | ORAL | 0 refills | Status: DC

## 2022-08-10 NOTE — ED Provider Notes (Addendum)
Renaldo Fiddler    CSN: [id number] Arrival date & time: 08/10/22  1037      History   Chief Complaint Chief Complaint  Patient presents with   Animal Bite    HPI Karla Martin is a 72 y.o. female.   Patient presents for evaluation of redness present to a cat bite that occurred 1 day prior.  Offending cat is domesticated, owned by the neighbor, unsure if up-to-date on vaccines but notified nurse that cat has had claws removed.  Initially cleansed with soap and water, area has scabbed.  Denies drainage or fevers.  Last tetanus May 2016.  Past Medical History:  Diagnosis Date   GERD (gastroesophageal reflux disease)    Hypercholesteremia    Hypertension     Patient Active Problem List   Diagnosis Date Noted   Seasonal allergic rhinitis due to pollen 06/28/2022   Essential hypertension, benign 06/28/2022   Mixed hyperlipidemia 06/28/2022   Toe fracture 05/26/2015    Past Surgical History:  Procedure Laterality Date   BREAST SURGERY     fibroid tumor   COLONOSCOPY WITH PROPOFOL N/A 01/19/2019   Procedure: COLONOSCOPY WITH PROPOFOL;  Surgeon: Toledo, Boykin Nearing, MD;  Location: [facility]COPY;  Service: Gastroenterology;  Laterality: N/A;   colonoscopy x3     UTERINE FIBROID SURGERY      OB History   No obstetric history on file.      Home Medications    Prior to Admission medications   Medication Sig Start Date End Date Taking? Authorizing Provider  amLODipine (NORVASC) 5 MG tablet Take 1 tablet (5 mg total) by mouth daily. 04/05/22  Yes Margaretann Loveless, MD  Ascorbic Acid (VITAMIN C) 1000 MG tablet Take by mouth.   Yes [provider]  calcium-vitamin D (OSCAL WITH D) 500-200 MG-UNIT tablet Take by mouth.   Yes [provider]  simvastatin (ZOCOR) 20 MG tablet TAKE 1 TABLET BY MOUTH EVERYDAY AT BEDTIME 07/02/22  Yes Margaretann Loveless, MD  Vitamin D, Ergocalciferol, (DRISDOL) 50000 units CAPS capsule Take by mouth.   Yes [provider]   doxycycline (VIBRA-TABS) 100 MG tablet Take 1 tablet (100 mg total) by mouth 2 (two) times daily. 06/28/22   Margaretann Loveless, MD  fexofenadine (ALLEGRA) 180 MG tablet Take 1 tablet (180 mg total) by mouth daily. 06/28/22   Margaretann Loveless, MD  fluticasone (FLONASE) 50 MCG/ACT nasal spray Place 1 spray into both nostrils daily. 06/28/22   Margaretann Loveless, MD    Family History History reviewed. No pertinent family history.  Social History Social History   Tobacco Use   Smoking status: Never   Smokeless tobacco: Never  Vaping Use   Vaping Use: Never used  Substance Use Topics   Alcohol use: Yes    Alcohol/week: 0.0 standard drinks of alcohol   Drug use: Never     Allergies   Erythromycin and Penicillins   Review of Systems Review of Systems   Physical Exam Triage Vital Signs ED Triage Vitals  Enc Vitals Group     BP 08/10/22 1102 (!) 146/81     Pulse Rate 08/10/22 1102 73     Resp 08/10/22 1102 16     Temp 08/10/22 1102 98.3 F (36.8 C)     Temp Source 08/10/22 1102 Oral     SpO2 08/10/22 1102 96 %     Weight --      Height --      Head Circumference --  Peak Flow --      Pain Score 08/10/22 1106 0     Pain Loc --      Pain Edu? --      Excl. in GC? --    No data found.  Updated Vital Signs BP (!) 146/81 (BP Location: Left Arm)   Pulse 73   Temp 98.3 F (36.8 C) (Oral)   Resp 16   SpO2 96%   Visual Acuity Right Eye Distance:   Left Eye Distance:   Bilateral Distance:    Right Eye Near:   Left Eye Near:    Bilateral Near:     Physical Exam Constitutional:      Appearance: Normal appearance.  Eyes:     Extraocular Movements: Extraocular movements intact.  Pulmonary:     Effort: Pulmonary effort is normal.  Skin:    Comments: Less than 0.5 cm puncture present to the posterior of the right forearm with surrounding erythema, skin hot to touch, nontender, nondraining  Neurological:     Mental Status: She is alert and oriented to person, place,  and time. Mental status is at baseline.      UC Treatments / Results  Labs (all labs ordered are listed, but only abnormal results are displayed) Labs Reviewed - No data to display  EKG   Radiology No results found.  Procedures Procedures (including critical care time)  Medications Ordered in UC Medications - No data to display  Initial Impression / Assessment and Plan / UC Course  I have reviewed the triage vital signs and the nursing notes.  Pertinent labs & imaging results that were available during my care of the patient were reviewed by me and considered in my medical decision making (see chart for details).  Cat bite of right forearm with infection, initial encounter  Consistent with infection, discussed with patient, prescribed doxycycline, penicillin allergy confirmed, rabies deferred as animal is domesticated and able to be monitored ,recommended supportive measures through RICE, heat rest elevation and over-the-counter analgesics, given strict precautions to follow-up for any concerns regarding healing, up-to-date on tetanus therefore booster deferred Final Clinical Impressions(s) / UC Diagnoses   Final diagnoses:  Cat bite of right forearm with infection, initial encounter     Discharge Instructions      On exam the area is red and warm to touch, consistent with infection  Animal mouth are also considered to be dirty therefore you will be started on antibiotic  Take doxycycline every morning and every evening for 7 days takes about 48 hours to fully get into the system  For any discomfort you may take Tylenol and/or Motrin  For comfort you may hold warm compresses or ice over the affected area in 10 to 15-minute intervals  For any concerns regarding healing please follow-up for reevaluation   ED Prescriptions   None    PDMP not reviewed this encounter.   Valinda Hoar, NP 08/10/22 1123    Salli Quarry R, NP 08/10/22 1124

## 2022-08-10 NOTE — ED Triage Notes (Signed)
Pt states she was bit by neighbors house cat yesterday on her right forearm. Pt now is having redness to the bit and wanted to make sure it was not infected. Pt is not interested in rabies vaccinee.

## 2022-08-10 NOTE — Discharge Instructions (Addendum)
On exam the area is red and warm to touch, consistent with infection  Animal mouth are also considered to be dirty therefore you will be started on antibiotic  Take doxycycline every morning and every evening for 7 days takes about 48 hours to fully get into the system  For any discomfort you may take Tylenol and/or Motrin  For comfort you may hold warm compresses or ice over the affected area in 10 to 15-minute intervals  For any concerns regarding healing please follow-up for reevaluation

## 2022-08-10 NOTE — Telephone Encounter (Signed)
not sent at time of visit, medication now sent to pharmacy

## 2022-08-23 ENCOUNTER — Ambulatory Visit (INDEPENDENT_AMBULATORY_CARE_PROVIDER_SITE_OTHER): Payer: Medicare Other | Admitting: Internal Medicine

## 2022-08-23 ENCOUNTER — Encounter: Payer: Self-pay | Admitting: Internal Medicine

## 2022-08-23 VITALS — BP 130/70 | HR 75 | Ht 64.0 in | Wt 161.6 lb

## 2022-08-23 DIAGNOSIS — I10 Essential (primary) hypertension: Secondary | ICD-10-CM | POA: Diagnosis not present

## 2022-08-23 DIAGNOSIS — R7302 Impaired glucose tolerance (oral): Secondary | ICD-10-CM | POA: Diagnosis not present

## 2022-08-23 DIAGNOSIS — Z13820 Encounter for screening for osteoporosis: Secondary | ICD-10-CM

## 2022-08-23 DIAGNOSIS — Z1231 Encounter for screening mammogram for malignant neoplasm of breast: Secondary | ICD-10-CM

## 2022-08-23 DIAGNOSIS — E782 Mixed hyperlipidemia: Secondary | ICD-10-CM

## 2022-08-23 NOTE — Progress Notes (Signed)
Established Patient Office Visit  Subjective:  Patient ID: Karla Martin, female    DOB: 03/04/1951  Age: 72 y.o. MRN: [id number]  Chief Complaint  Patient presents with   Follow-up    6 mo F/U    Patient in office for 6 month follow up. Patient doing well. Due for a mammogram. Previous colonoscopy 01/2019. Will get fasting blood work.  Has no new complaints today.    No other concerns at this time.   Past Medical History:  Diagnosis Date   GERD (gastroesophageal reflux disease)    Hypercholesteremia    Hypertension     Past Surgical History:  Procedure Laterality Date   BREAST SURGERY     fibroid tumor   COLONOSCOPY WITH PROPOFOL N/A 01/19/2019   Procedure: COLONOSCOPY WITH PROPOFOL;  Surgeon: Toledo, Boykin Nearing, MD;  Location: [facility]COPY;  Service: Gastroenterology;  Laterality: N/A;   colonoscopy x3     UTERINE FIBROID SURGERY      Social History   Socioeconomic History   Marital status: Single    Spouse name: Not on file   Number of children: Not on file   Years of education: Not on file   Highest education level: Not on file  Occupational History   Not on file  Tobacco Use   Smoking status: Never   Smokeless tobacco: Never  Vaping Use   Vaping Use: Never used  Substance and Sexual Activity   Alcohol use: Yes    Alcohol/week: 0.0 standard drinks of alcohol   Drug use: Never   Sexual activity: Yes  Other Topics Concern   Not on file  Social History Narrative   Not on file   Social Determinants of Health   Financial Resource Strain: Not on file  Food Insecurity: Not on file  Transportation Needs: Not on file  Physical Activity: Not on file  Stress: Not on file  Social Connections: Not on file  Intimate Partner Violence: Not on file    History reviewed. No pertinent family history.  Allergies  Allergen Reactions   Erythromycin    Penicillins     Review of Systems  Constitutional: Negative.   HENT: Negative.    Eyes: Negative.    Respiratory: Negative.  Negative for cough, shortness of breath and wheezing.   Cardiovascular: Negative.  Negative for chest pain, palpitations and leg swelling.  Gastrointestinal: Negative.  Negative for abdominal pain, constipation, diarrhea, heartburn, nausea and vomiting.  Genitourinary: Negative.   Musculoskeletal: Negative.  Negative for falls, joint pain and myalgias.  Skin: Negative.   Neurological: Negative.  Negative for dizziness and headaches.  Endo/Heme/Allergies: Negative.   Psychiatric/Behavioral: Negative.  Negative for depression. The patient is not nervous/anxious.        Objective:   BP 130/70   Pulse 75   Ht 5\' 4"  (1.626 m)   Wt 161 lb 9.6 oz (73.3 kg)   SpO2 97%   BMI 27.74 kg/m   Vitals:   08/23/22 0915  BP: 130/70  Pulse: 75  Height: 5\' 4"  (1.626 m)  Weight: 161 lb 9.6 oz (73.3 kg)  SpO2: 97%  BMI (Calculated): 27.72    Physical Exam Vitals and nursing note reviewed.  Constitutional:      Appearance: Normal appearance.  HENT:     Head: Normocephalic and atraumatic.     Nose: Nose normal.  Cardiovascular:     Rate and Rhythm: Normal rate and regular rhythm.     Pulses: Normal pulses.  Heart sounds: Normal heart sounds. No murmur heard. Pulmonary:     Effort: Pulmonary effort is normal.     Breath sounds: Normal breath sounds. No wheezing, rhonchi or rales.  Abdominal:     General: Bowel sounds are normal. There is no distension.     Palpations: Abdomen is soft. There is no mass.     Tenderness: There is no abdominal tenderness. There is no right CVA tenderness, left CVA tenderness or guarding.  Musculoskeletal:        General: Normal range of motion.     Cervical back: Normal range of motion.  Skin:    General: Skin is warm and dry.  Neurological:     General: No focal deficit present.     Mental Status: She is alert and oriented to person, place, and time.  Psychiatric:        Mood and Affect: Mood normal.        Behavior:  Behavior normal.      No results found for any visits on 08/23/22.  No results found for this or any previous visit (from the past 2160 hour(s)).    Assessment & Plan:  Patient feeling well. Blood pressure well controlled. Due for a mammogram, order placed. Fasting labs today.  Previous colonoscopy 01/2019.  Problem List Items Addressed This Visit     Essential hypertension, benign - Primary   Relevant Orders   CMP14+EGFR   Mixed hyperlipidemia   Relevant Orders   Lipid Panel w/o Chol/HDL Ratio   Screening for osteoporosis   Impaired glucose tolerance   Relevant Orders   Hemoglobin A1c    Return in about 6 months (around 02/22/2023).   Total time spent:  Margaretann Loveless, MD  08/23/2022   This document may have been prepared by [facility] Voice Recognition software and as such may include unintentional dictation errors.

## 2022-08-25 LAB — CMP14+EGFR
ALT: 15 IU/L (ref 0–32)
AST: 21 IU/L (ref 0–40)
Albumin: 4.7 g/dL (ref 3.8–4.8)
Alkaline Phosphatase: 70 IU/L (ref 44–121)
BUN/Creatinine Ratio: 21 (ref 12–28)
BUN: 17 mg/dL (ref 8–27)
Bilirubin Total: 0.4 mg/dL (ref 0.0–1.2)
CO2: 22 mmol/L (ref 20–29)
Calcium: 9.5 mg/dL (ref 8.7–10.3)
Chloride: 102 mmol/L (ref 96–106)
Creatinine, Ser: 0.8 mg/dL (ref 0.57–1.00)
Globulin, Total: 2.3 g/dL (ref 1.5–4.5)
Glucose: 97 mg/dL (ref 70–99)
Potassium: 4.7 mmol/L (ref 3.5–5.2)
Sodium: 140 mmol/L (ref 134–144)
Total Protein: 7 g/dL (ref 6.0–8.5)
eGFR: 78 mL/min/{1.73_m2} (ref >59)

## 2022-08-25 LAB — HEMOGLOBIN A1C
Est. average glucose Bld gHb Est-mCnc: 111 mg/dL
Hgb A1c MFr Bld: 5.5 % (ref 4.8–5.6)

## 2022-08-25 LAB — LIPID PANEL W/O CHOL/HDL RATIO
Cholesterol, Total: 202 mg/dL — ABNORMAL HIGH (ref 100–199)
HDL: 82 mg/dL (ref >39)
LDL Chol Calc (NIH): 108 mg/dL — ABNORMAL HIGH (ref 0–99)
Triglycerides: 66 mg/dL (ref 0–149)
VLDL Cholesterol Cal: 12 mg/dL (ref 5–40)

## 2022-08-27 ENCOUNTER — Encounter: Payer: Self-pay | Admitting: Internal Medicine

## 2022-09-18 ENCOUNTER — Encounter: Payer: Self-pay | Admitting: Internal Medicine

## 2023-01-08 ENCOUNTER — Ambulatory Visit (INDEPENDENT_AMBULATORY_CARE_PROVIDER_SITE_OTHER): Payer: Medicare Other | Admitting: Dermatology

## 2023-01-08 ENCOUNTER — Encounter: Payer: Self-pay | Admitting: Dermatology

## 2023-01-08 DIAGNOSIS — L821 Other seborrheic keratosis: Secondary | ICD-10-CM

## 2023-01-08 DIAGNOSIS — L820 Inflamed seborrheic keratosis: Secondary | ICD-10-CM | POA: Diagnosis not present

## 2023-01-08 NOTE — Patient Instructions (Addendum)

## 2023-01-08 NOTE — Progress Notes (Signed)
   Follow-Up Visit   Subjective  Karla Martin is a 72 y.o. female who presents for the following: spot at scalp, present for a few weeks, patient scratches at it.  The patient has spots, moles and lesions to be evaluated, some may be new or changing and the patient may have concern these could be cancer.   The following portions of the chart were reviewed this encounter and updated as appropriate: medications, allergies, medical history  Review of Systems:  No other skin or systemic complaints except as noted in HPI or Assessment and Plan.  Objective  Well appearing patient in no apparent distress; mood and affect are within normal limits.   A focused examination was performed of the following areas: scalp  Relevant exam findings are noted in the Assessment and Plan.  R frontal scalp Erythematous stuck-on, waxy papule or plaque       Assessment & Plan     Inflamed seborrheic keratosis R frontal scalp  Symptomatic, irritating, patient would like treated.  Benign-appearing.  Call clinic for new or changing lesions.    Destruction of lesion - R frontal scalp Complexity: simple   Destruction method: cryotherapy   Informed consent: discussed and consent obtained   Timeout:  patient name, date of birth, surgical site, and procedure verified Lesion destroyed using liquid nitrogen: Yes   Region frozen until ice ball extended beyond lesion: Yes   Cryo cycles: 1 or 2. Outcome: patient tolerated procedure well with no complications   Post-procedure details: wound care instructions given    SEBORRHEIC KERATOSIS - Stuck-on, waxy, tan-brown papules and/or plaques  - Benign-appearing - Discussed benign etiology and prognosis. - Observe - Call for any changes   Return for TBSE, with Dr. Kirtland Bouchard, as scheduled.  Anise Salvo, RMA, am acting as scribe for Elie Goody, MD .   Documentation: I have reviewed the above documentation for accuracy and completeness, and  I agree with the above.  Elie Goody, MD

## 2023-02-05 ENCOUNTER — Encounter: Payer: Self-pay | Admitting: Internal Medicine

## 2023-02-05 ENCOUNTER — Other Ambulatory Visit: Payer: Self-pay | Admitting: Internal Medicine

## 2023-02-05 DIAGNOSIS — I10 Essential (primary) hypertension: Secondary | ICD-10-CM

## 2023-02-05 DIAGNOSIS — R7303 Prediabetes: Secondary | ICD-10-CM

## 2023-02-05 DIAGNOSIS — E782 Mixed hyperlipidemia: Secondary | ICD-10-CM

## 2023-02-09 LAB — LIPID PANEL W/O CHOL/HDL RATIO
Cholesterol, Total: 193 mg/dL (ref 100–199)
HDL: 89 mg/dL (ref >39)
LDL Chol Calc (NIH): 90 mg/dL (ref 0–99)
Triglycerides: 76 mg/dL (ref 0–149)
VLDL Cholesterol Cal: 14 mg/dL (ref 5–40)

## 2023-02-09 LAB — CMP14+EGFR
ALT: 21 [IU]/L (ref 0–32)
AST: 21 [IU]/L (ref 0–40)
Albumin: 4.5 g/dL (ref 3.8–4.8)
Alkaline Phosphatase: 65 [IU]/L (ref 44–121)
BUN/Creatinine Ratio: 20 (ref 12–28)
BUN: 17 mg/dL (ref 8–27)
Bilirubin Total: 0.4 mg/dL (ref 0.0–1.2)
CO2: 25 mmol/L (ref 20–29)
Calcium: 9.6 mg/dL (ref 8.7–10.3)
Chloride: 106 mmol/L (ref 96–106)
Creatinine, Ser: 0.85 mg/dL (ref 0.57–1.00)
Globulin, Total: 2.3 g/dL (ref 1.5–4.5)
Glucose: 103 mg/dL — ABNORMAL HIGH (ref 70–99)
Potassium: 4.3 mmol/L (ref 3.5–5.2)
Sodium: 142 mmol/L (ref 134–144)
Total Protein: 6.8 g/dL (ref 6.0–8.5)
eGFR: 73 mL/min/{1.73_m2} (ref >59)

## 2023-02-09 LAB — HEMOGLOBIN A1C
Est. average glucose Bld gHb Est-mCnc: 114 mg/dL
Hgb A1c MFr Bld: 5.6 % (ref 4.8–5.6)

## 2023-02-22 ENCOUNTER — Encounter: Payer: Self-pay | Admitting: Internal Medicine

## 2023-02-22 ENCOUNTER — Ambulatory Visit (INDEPENDENT_AMBULATORY_CARE_PROVIDER_SITE_OTHER): Payer: Medicare Other | Admitting: Internal Medicine

## 2023-02-22 VITALS — BP 124/81 | HR 80 | Ht 64.0 in | Wt 170.0 lb

## 2023-02-22 DIAGNOSIS — R7303 Prediabetes: Secondary | ICD-10-CM | POA: Insufficient documentation

## 2023-02-22 DIAGNOSIS — I10 Essential (primary) hypertension: Secondary | ICD-10-CM | POA: Diagnosis not present

## 2023-02-22 DIAGNOSIS — Z0000 Encounter for general adult medical examination without abnormal findings: Secondary | ICD-10-CM

## 2023-02-22 DIAGNOSIS — E782 Mixed hyperlipidemia: Secondary | ICD-10-CM | POA: Diagnosis not present

## 2023-02-22 NOTE — Progress Notes (Signed)
Established Patient Office Visit  Subjective:  Patient ID: Karla Martin, female    DOB: 08/01/1950  Age: 72 y.o. MRN: [id number]  Chief Complaint  Patient presents with   Follow-up    Patient is here for her 45-month follow-up as well as annual wellness visit.  She generally feels well but admits to eating more than usual, does has gained weight.  Her blood pressure is slightly above normal as well.  Labs were done recently and results discussed.  Patient agrees to start a strict diet control as well as increasing physical activity. She is up-to-date on her mammogram and DEXA. Current colonoscopy is due next year as she has history of colon polyps. PHQ-9/GAD score is 1/0. CFS score is 0.    No other concerns at this time.   Past Medical History:  Diagnosis Date   GERD (gastroesophageal reflux disease)    Hypercholesteremia    Hypertension     Past Surgical History:  Procedure Laterality Date   BREAST SURGERY     fibroid tumor   COLONOSCOPY WITH PROPOFOL N/A 01/19/2019   Procedure: COLONOSCOPY WITH PROPOFOL;  Surgeon: Toledo, Boykin Nearing, MD;  Location: [facility] ENDOSCOPY;  Service: Gastroenterology;  Laterality: N/A;   colonoscopy x3     UTERINE FIBROID SURGERY      Social History   Socioeconomic History   Marital status: Single    Spouse name: Not on file   Number of children: Not on file   Years of education: Not on file   Highest education level: Not on file  Occupational History   Not on file  Tobacco Use   Smoking status: Never   Smokeless tobacco: Never  Vaping Use   Vaping status: Never Used  Substance and Sexual Activity   Alcohol use: Yes    Alcohol/week: 0.0 standard drinks of alcohol   Drug use: Never   Sexual activity: Yes  Other Topics Concern   Not on file  Social History Narrative   Not on file   Social Drivers of Health   Financial Resource Strain: Not on file  Food Insecurity: No Food Insecurity (02/22/2023)   Hunger Vital Sign     Worried About Running Out of Food in the Last Year: Never true    Ran Out of Food in the Last Year: Never true  Transportation Needs: No Transportation Needs (02/22/2023)   PRAPARE - Administrator, Civil Service (Medical): No    Lack of Transportation (Non-Medical): No  Physical Activity: Not on file  Stress: No Stress Concern Present (02/22/2023)   [employer] of Occupational Health - Occupational Stress Questionnaire    Feeling of Stress : Not at all  Social Connections: Not on file  Intimate Partner Violence: Not At Risk (02/22/2023)   Humiliation, Afraid, Rape, and Kick questionnaire    Fear of Current or Ex-Partner: No    Emotionally Abused: No    Physically Abused: No    Sexually Abused: No    History reviewed. No pertinent family history.  Allergies  Allergen Reactions   Erythromycin    Penicillins     Outpatient Medications Prior to Visit  Medication Sig   amLODipine (NORVASC) 5 MG tablet Take 1 tablet (5 mg total) by mouth daily.   Ascorbic Acid (VITAMIN C) 1000 MG tablet Take by mouth.   calcium-vitamin D (OSCAL WITH D) 500-200 MG-UNIT tablet Take by mouth.   simvastatin (ZOCOR) 20 MG tablet TAKE 1 TABLET BY MOUTH EVERYDAY AT  BEDTIME   No facility-administered medications prior to visit.    Review of Systems  Constitutional: Negative.  Negative for chills, fever, malaise/fatigue and weight loss.  HENT: Negative.  Negative for sinus pain and sore throat.   Eyes: Negative.   Respiratory: Negative.  Negative for cough, shortness of breath and stridor.   Cardiovascular: Negative.  Negative for chest pain, palpitations and leg swelling.  Gastrointestinal: Negative.  Negative for abdominal pain, constipation, diarrhea, heartburn, nausea and vomiting.  Genitourinary: Negative.  Negative for dysuria and flank pain.  Musculoskeletal: Negative.  Negative for joint pain and myalgias.  Skin: Negative.   Neurological: Negative.  Negative for dizziness,  tingling, tremors and headaches.  Endo/Heme/Allergies: Negative.   Psychiatric/Behavioral: Negative.  Negative for depression and suicidal ideas. The patient is not nervous/anxious.        Objective:   BP 124/81   Pulse 80   Ht 5\' 4"  (1.626 m)   Wt 170 lb (77.1 kg)   SpO2 98%   BMI 29.18 kg/m   Vitals:   02/22/23 0900  BP: 124/81  Pulse: 80  Height: 5\' 4"  (1.626 m)  Weight: 170 lb (77.1 kg)  SpO2: 98%  BMI (Calculated): 29.17    Physical Exam Vitals and nursing note reviewed.  Constitutional:      Appearance: Normal appearance.  HENT:     Head: Normocephalic and atraumatic.     Nose: Nose normal.     Mouth/Throat:     Mouth: Mucous membranes are moist.     Pharynx: Oropharynx is clear.  Eyes:     Conjunctiva/sclera: Conjunctivae normal.     Pupils: Pupils are equal, round, and reactive to light.  Cardiovascular:     Rate and Rhythm: Normal rate and regular rhythm.     Pulses: Normal pulses.     Heart sounds: Normal heart sounds. No murmur heard. Pulmonary:     Effort: Pulmonary effort is normal.     Breath sounds: Normal breath sounds. No wheezing.  Abdominal:     General: Bowel sounds are normal.     Palpations: Abdomen is soft.     Tenderness: There is no abdominal tenderness. There is no right CVA tenderness or left CVA tenderness.  Musculoskeletal:        General: Normal range of motion.     Cervical back: Normal range of motion.     Right lower leg: No edema.     Left lower leg: No edema.  Skin:    General: Skin is warm and dry.  Neurological:     General: No focal deficit present.     Mental Status: She is alert and oriented to person, place, and time.  Psychiatric:        Mood and Affect: Mood normal.        Behavior: Behavior normal.      No results found for any visits on 02/22/23.  Recent Results (from the past 2160 hours)  CMP14+EGFR     Status: Abnormal   Collection Time: 02/08/23  7:03 AM  Result Value Ref Range   Glucose 103 (H)  70 - 99 mg/dL   BUN 17 8 - 27 mg/dL   Creatinine, Ser 4.03 0.57 - 1.00 mg/dL   eGFR 73 >47 QQ/VZD/6.38   BUN/Creatinine Ratio 20 12 - 28   Sodium 142 134 - 144 mmol/L   Potassium 4.3 3.5 - 5.2 mmol/L   Chloride 106 96 - 106 mmol/L   CO2 25 20 - 29  mmol/L   Calcium 9.6 8.7 - 10.3 mg/dL   Total Protein 6.8 6.0 - 8.5 g/dL   Albumin 4.5 3.8 - 4.8 g/dL   Globulin, Total 2.3 1.5 - 4.5 g/dL   Bilirubin Total 0.4 0.0 - 1.2 mg/dL   Alkaline Phosphatase 65 44 - 121 IU/L   AST 21 0 - 40 IU/L   ALT 21 0 - 32 IU/L  Lipid Panel w/o Chol/HDL Ratio     Status: None   Collection Time: 02/08/23  7:03 AM  Result Value Ref Range   Cholesterol, Total 193 100 - 199 mg/dL   Triglycerides 76 0 - 149 mg/dL   HDL 89 >66 mg/dL   VLDL Cholesterol Cal 14 5 - 40 mg/dL   LDL Chol Calc (NIH) 90 0 - 99 mg/dL  Hemoglobin Y4I     Status: None   Collection Time: 02/08/23  7:03 AM  Result Value Ref Range   Hgb A1c MFr Bld 5.6 4.8 - 5.6 %    Comment:          Prediabetes: 5.7 - 6.4          Diabetes: >6.4          Glycemic control for adults with diabetes: <7.0    Est. average glucose Bld gHb Est-mCnc 114 mg/dL      Assessment & Plan:  Patient will continue all her medications.  Strict diet control and exercise emphasized in order to lose weight and to control her blood pressure. Patient will return in 3 months for blood pressure monitoring.. Problem List Items Addressed This Visit     Essential hypertension, benign   Mixed hyperlipidemia   Prediabetes   Other Visit Diagnoses       Medicare annual wellness visit, subsequent    -  Primary       Return in about 3 months (around 05/23/2023).   Total time spent: 30 minutes  Margaretann Loveless, MD  02/22/2023   This document may have been prepared by [facility] Voice Recognition software and as such may include unintentional dictation errors.

## 2023-03-24 ENCOUNTER — Other Ambulatory Visit: Payer: Self-pay | Admitting: Internal Medicine

## 2023-05-24 ENCOUNTER — Encounter: Payer: Self-pay | Admitting: Internal Medicine

## 2023-05-24 ENCOUNTER — Ambulatory Visit: Payer: Medicare Other | Admitting: Internal Medicine

## 2023-05-24 VITALS — BP 112/76 | HR 58 | Ht 64.0 in | Wt 163.8 lb

## 2023-05-24 DIAGNOSIS — R7303 Prediabetes: Secondary | ICD-10-CM

## 2023-05-24 DIAGNOSIS — E782 Mixed hyperlipidemia: Secondary | ICD-10-CM

## 2023-05-24 DIAGNOSIS — I10 Essential (primary) hypertension: Secondary | ICD-10-CM

## 2023-05-24 NOTE — Progress Notes (Signed)
 Established Patient Office Visit  Subjective:  Patient ID: Karla Martin, female    DOB: [birth year]  Age: 73 y.o. MRN: [id number]  Chief Complaint  Patient presents with   Follow-up    3 month follow up    Patient comes in for follow-up.  We were monitoring her weight and blood pressure.  Patient has been more active than usual and was able to lose weight with diet control and exercise.  She is feeling well generally and has no new complaints.    No other concerns at this time.   Past Medical History:  Diagnosis Date   GERD (gastroesophageal reflux disease)    Hypercholesteremia    Hypertension     Past Surgical History:  Procedure Laterality Date   BREAST SURGERY     fibroid tumor   COLONOSCOPY WITH PROPOFOL N/A 01/19/2019   Procedure: COLONOSCOPY WITH PROPOFOL;  Surgeon: Toledo, Boykin Nearing, MD;  Location: [facility] ENDOSCOPY;  Service: Gastroenterology;  Laterality: N/A;   colonoscopy x3     UTERINE FIBROID SURGERY      Social History   Socioeconomic History   Marital status: Single    Spouse name: Not on file   Number of children: Not on file   Years of education: Not on file   Highest education level: Not on file  Occupational History   Not on file  Tobacco Use   Smoking status: Never   Smokeless tobacco: Never  Vaping Use   Vaping status: Never Used  Substance and Sexual Activity   Alcohol use: Yes    Alcohol/week: 0.0 standard drinks of alcohol   Drug use: Never   Sexual activity: Yes  Other Topics Concern   Not on file  Social History Narrative   Not on file   Social Drivers of Health   Financial Resource Strain: Not on file  Food Insecurity: No Food Insecurity (02/22/2023)   Hunger Vital Sign    Worried About Running Out of Food in the Last Year: Never true    Ran Out of Food in the Last Year: Never true  Transportation Needs: No Transportation Needs (02/22/2023)   PRAPARE - Administrator, Civil Service (Medical): No    Lack of  Transportation (Non-Medical): No  Physical Activity: Not on file  Stress: No Stress Concern Present (02/22/2023)   [employer] of Occupational Health - Occupational Stress Questionnaire    Feeling of Stress : Not at all  Social Connections: Not on file  Intimate Partner Violence: Not At Risk (02/22/2023)   Humiliation, Afraid, Rape, and Kick questionnaire    Fear of Current or Ex-Partner: No    Emotionally Abused: No    Physically Abused: No    Sexually Abused: No    History reviewed. No pertinent family history.  Allergies  Allergen Reactions   Erythromycin    Penicillins     Outpatient Medications Prior to Visit  Medication Sig   amLODipine (NORVASC) 5 MG tablet TAKE 1 TABLET (5 MG TOTAL) BY MOUTH DAILY.   Ascorbic Acid (VITAMIN C) 1000 MG tablet Take by mouth.   calcium-vitamin D (OSCAL WITH D) 500-200 MG-UNIT tablet Take by mouth.   polycarbophil (FIBERCON) 625 MG tablet Take 625 mg by mouth daily.   simvastatin (ZOCOR) 20 MG tablet TAKE 1 TABLET BY MOUTH EVERYDAY AT BEDTIME   No facility-administered medications prior to visit.    Review of Systems  Constitutional:  Positive for malaise/fatigue. Negative for chills, diaphoresis, fever and  weight loss.  HENT: Negative.  Negative for congestion and sore throat.   Eyes: Negative.   Respiratory: Negative.  Negative for cough, shortness of breath and stridor.   Cardiovascular: Negative.  Negative for chest pain, palpitations and leg swelling.  Gastrointestinal: Negative.  Negative for abdominal pain, constipation, diarrhea, heartburn, nausea and vomiting.  Genitourinary: Negative.  Negative for dysuria and flank pain.  Musculoskeletal: Negative.  Negative for joint pain and myalgias.  Skin: Negative.   Neurological: Negative.  Negative for dizziness and headaches.  Endo/Heme/Allergies: Negative.   Psychiatric/Behavioral: Negative.  Negative for depression and suicidal ideas. The patient is not nervous/anxious.         Objective:   BP 112/76   Pulse (!) 58   Ht 5\' 4"  (1.626 m)   Wt 163 lb 12.8 oz (74.3 kg)   SpO2 98%   BMI 28.12 kg/m   Vitals:   05/24/23 0858  BP: 112/76  Pulse: (!) 58  Height: 5\' 4"  (1.626 m)  Weight: 163 lb 12.8 oz (74.3 kg)  SpO2: 98%  BMI (Calculated): 28.1    Physical Exam Vitals and nursing note reviewed.  Constitutional:      Appearance: Normal appearance.  HENT:     Head: Normocephalic and atraumatic.     Nose: Nose normal.     Mouth/Throat:     Mouth: Mucous membranes are moist.     Pharynx: Oropharynx is clear.  Eyes:     Conjunctiva/sclera: Conjunctivae normal.     Pupils: Pupils are equal, round, and reactive to light.  Cardiovascular:     Rate and Rhythm: Normal rate and regular rhythm.     Pulses: Normal pulses.     Heart sounds: Normal heart sounds. No murmur heard. Pulmonary:     Effort: Pulmonary effort is normal.     Breath sounds: Normal breath sounds. No wheezing.  Abdominal:     General: Bowel sounds are normal.     Palpations: Abdomen is soft.     Tenderness: There is no abdominal tenderness. There is no right CVA tenderness or left CVA tenderness.  Musculoskeletal:        General: Normal range of motion.     Cervical back: Normal range of motion.     Right lower leg: No edema.     Left lower leg: No edema.  Skin:    General: Skin is warm and dry.  Neurological:     General: No focal deficit present.     Mental Status: She is alert and oriented to person, place, and time.  Psychiatric:        Mood and Affect: Mood normal.        Behavior: Behavior normal.      No results found for any visits on 05/24/23.  No results found for this or any previous visit (from the past 2160 hours).    Assessment & Plan:  Patient to continue diet control, exercise and weight reduction.  Will check labs before next visit. Problem List Items Addressed This Visit     Essential hypertension, benign - Primary   Mixed hyperlipidemia    Prediabetes    Follow up as scheduled.  Total time spent: 25 minutes  Margaretann Loveless, MD  05/24/2023   This document may have been prepared by [facility] Voice Recognition software and as such may include unintentional dictation errors.

## 2023-06-27 ENCOUNTER — Ambulatory Visit: Payer: Medicare Other | Admitting: Dermatology

## 2023-06-27 ENCOUNTER — Encounter: Payer: Self-pay | Admitting: Dermatology

## 2023-06-27 DIAGNOSIS — L918 Other hypertrophic disorders of the skin: Secondary | ICD-10-CM

## 2023-06-27 DIAGNOSIS — L578 Other skin changes due to chronic exposure to nonionizing radiation: Secondary | ICD-10-CM | POA: Diagnosis not present

## 2023-06-27 DIAGNOSIS — D229 Melanocytic nevi, unspecified: Secondary | ICD-10-CM

## 2023-06-27 DIAGNOSIS — D492 Neoplasm of unspecified behavior of bone, soft tissue, and skin: Secondary | ICD-10-CM

## 2023-06-27 DIAGNOSIS — L821 Other seborrheic keratosis: Secondary | ICD-10-CM | POA: Diagnosis not present

## 2023-06-27 DIAGNOSIS — Z1283 Encounter for screening for malignant neoplasm of skin: Secondary | ICD-10-CM | POA: Diagnosis not present

## 2023-06-27 DIAGNOSIS — L820 Inflamed seborrheic keratosis: Secondary | ICD-10-CM | POA: Diagnosis not present

## 2023-06-27 DIAGNOSIS — L814 Other melanin hyperpigmentation: Secondary | ICD-10-CM

## 2023-06-27 DIAGNOSIS — Z872 Personal history of diseases of the skin and subcutaneous tissue: Secondary | ICD-10-CM

## 2023-06-27 DIAGNOSIS — L570 Actinic keratosis: Secondary | ICD-10-CM

## 2023-06-27 DIAGNOSIS — W908XXA Exposure to other nonionizing radiation, initial encounter: Secondary | ICD-10-CM

## 2023-06-27 DIAGNOSIS — D1801 Hemangioma of skin and subcutaneous tissue: Secondary | ICD-10-CM

## 2023-06-27 DIAGNOSIS — D485 Neoplasm of uncertain behavior of skin: Secondary | ICD-10-CM

## 2023-06-27 DIAGNOSIS — I781 Nevus, non-neoplastic: Secondary | ICD-10-CM

## 2023-06-27 HISTORY — DX: Personal history of diseases of the skin and subcutaneous tissue: Z87.2

## 2023-06-27 NOTE — Patient Instructions (Signed)
 Wound Care Instructions  Cleanse wound gently with soap and water once a day then pat dry with clean gauze. Apply a thin coat of Petrolatum (petroleum jelly, "Vaseline") over the wound (unless you have an allergy to this). We recommend that you use a new, sterile tube of Vaseline. Do not pick or remove scabs. Do not remove the yellow or white "healing tissue" from the base of the wound.  Cover the wound with fresh, clean, nonstick gauze and secure with paper tape. You may use Band-Aids in place of gauze and tape if the wound is small enough, but would recommend trimming much of the tape off as there is often too much. Sometimes Band-Aids can irritate the skin.  You should call the office for your biopsy report after 1 week if you have not already been contacted.  If you experience any problems, such as abnormal amounts of bleeding, swelling, significant bruising, significant pain, or evidence of infection, please call the office immediately.  FOR ADULT SURGERY PATIENTS: If you need something for pain relief you may take 1 extra strength Tylenol (acetaminophen) AND 2 Ibuprofen (200mg  each) together every 4 hours as needed for pain. (do not take these if you are allergic to them or if you have a reason you should not take them.) Typically, you may only need pain medication for 1 to 3 days.        Cryotherapy Aftercare  Wash gently with soap and water everyday.   Apply Vaseline and Band-Aid daily until healed.    Melanoma ABCDEs  Melanoma is the most dangerous type of skin cancer, and is the leading cause of death from skin disease.  You are more likely to develop melanoma if you: Have light-colored skin, light-colored eyes, or red or blond hair Spend a lot of time in the sun Tan regularly, either outdoors or in a tanning bed Have had blistering sunburns, especially during childhood Have a close family member who has had a melanoma Have atypical moles or large birthmarks  Early  detection of melanoma is key since treatment is typically straightforward and cure rates are extremely high if we catch it early.   The first sign of melanoma is often a change in a mole or a new dark spot.  The ABCDE system is a way of remembering the signs of melanoma.  A for asymmetry:  The two halves do not match. B for border:  The edges of the growth are irregular. C for color:  A mixture of colors are present instead of an even brown color. D for diameter:  Melanomas are usually (but not always) greater than 6mm - the size of a pencil eraser. E for evolution:  The spot keeps changing in size, shape, and color.  Please check your skin once per month between visits. You can use a small mirror in front and a large mirror behind you to keep an eye on the back side or your body.   If you see any new or changing lesions before your next follow-up, please call to schedule a visit.  Please continue daily skin protection including broad spectrum sunscreen SPF 30+ to sun-exposed areas, reapplying every 2 hours as needed when you're outdoors.      Due to recent changes in healthcare laws, you may see results of your pathology and/or laboratory studies on MyChart before the doctors have had a chance to review them. We understand that in some cases there may be results that are confusing or concerning  to you. Please understand that not all results are received at the same time and often the doctors may need to interpret multiple results in order to provide you with the best plan of care or course of treatment. Therefore, we ask that you please give [place] 2 business days to thoroughly review all your results before contacting the office for clarification. Should we see a critical lab result, you will be contacted sooner.   If You Need Anything After Your Visit  If you have any questions or concerns for your doctor, please call our main line at [phone] and press option 4 to reach your doctor's  medical assistant. If no one answers, please leave a voicemail as directed and we will return your call as soon as possible. Messages left after 4 pm will be answered the following business day.   You may also send [place] a message via MyChart. We typically respond to MyChart messages within 1-2 business days.  For prescription refills, please ask your pharmacy to contact our office. Our fax number is [phone].  If you have an urgent issue when the clinic is closed that cannot wait until the next business day, you can page your doctor at the number below.    Please note that while we do our best to be available for urgent issues outside of office hours, we are not available 24/7.   If you have an urgent issue and are unable to reach [place], you may choose to seek medical care at your doctor's office, retail clinic, urgent care center, or emergency room.  If you have a medical emergency, please immediately call 911 or go to the emergency department.  Pager Numbers  - Dr. Gwen Pounds: [phone]  - Dr. Roseanne Reno: [phone]  - Dr. Katrinka Blazing: [phone]   In the event of inclement weather, please call our main line at [phone] for an update on the status of any delays or closures.  Dermatology Medication Tips: Please keep the boxes that topical medications come in in order to help keep track of the instructions about where and how to use these. Pharmacies typically print the medication instructions only on the boxes and not directly on the medication tubes.   If your medication is too expensive, please contact our office at [phone] option 4 or send [place] a message through MyChart.   We are unable to tell what your co-pay for medications will be in advance as this is different depending on your insurance coverage. However, we may be able to find a substitute medication at lower cost or fill out paperwork to get insurance to cover a needed medication.   If a prior authorization is required  to get your medication covered by your insurance company, please allow [place] 1-2 business days to complete this process.  Drug prices often vary depending on where the prescription is filled and some pharmacies may offer cheaper prices.  The website www.goodrx.com contains coupons for medications through different pharmacies. The prices here do not account for what the cost may be with help from insurance (it may be cheaper with your insurance), but the website can give you the price if you did not use any insurance.  - You can print the associated coupon and take it with your prescription to the pharmacy.  - You may also stop by our office during regular business hours and pick up a GoodRx coupon card.  - If you need your prescription sent electronically to a different pharmacy, notify our office through  [place] MyChart or by phone at [phone] option 4.     Si Usted Necesita Algo Despus de Su Visita  Tambin puede enviarnos un mensaje a travs de Clinical cytogeneticist. Por lo general respondemos a los mensajes de MyChart en el transcurso de 1 a 2 das hbiles.  Para renovar recetas, por favor pida a su farmacia que se ponga en contacto con nuestra oficina. Annie Sable de fax es [place] [phone].  Si tiene un asunto urgente cuando la clnica est cerrada y que no puede esperar hasta el siguiente da hbil, puede llamar/localizar a su doctor(a) al nmero que aparece a continuacin.   Por favor, tenga en cuenta que aunque hacemos todo lo posible para estar disponibles para asuntos urgentes fuera del horario de [place], no estamos disponibles las 24 horas del da, los 7 [address]  [address] de la [place].   Si tiene un problema urgente y no puede comunicarse con nosotros, puede optar por buscar atencin mdica  en el consultorio de su doctor(a), en una clnica privada, en un centro de atencin urgente o en una sala de emergencias.  Si tiene Engineer, drilling, por favor llame inmediatamente al 911 o vaya a la sala  de emergencias.  Nmeros de bper  - Dr. Gwen Pounds: [phone]  - Dra. Roseanne Reno: [phone]  - Dr. Katrinka Blazing: [phone]   En caso de inclemencias del tiempo, por favor llame a Lacy Duverney principal al [phone] para una actualizacin sobre el [place] de cualquier retraso o cierre.  Consejos para la medicacin en dermatologa: Por favor, guarde las cajas en las que vienen los medicamentos de uso tpico para ayudarle a seguir las instrucciones sobre dnde y cmo usarlos. Las farmacias generalmente imprimen las instrucciones del medicamento slo en las cajas y no directamente en los tubos del [place].   Si su medicamento es muy caro, por favor, pngase en contacto con Rolm Gala llamando al [phone] y presione la opcin 4 o envenos un mensaje a travs de Clinical cytogeneticist.   No podemos decirle cul ser su copago por los medicamentos por adelantado ya que esto es diferente dependiendo de la cobertura de su seguro. Sin embargo, es posible que podamos encontrar un medicamento sustituto a Audiological scientist un formulario para que el seguro cubra el medicamento que se considera necesario.   Si se requiere una autorizacin previa para que su compaa de seguros [place] su medicamento, por favor permtanos de 1 a 2 das hbiles para completar [address]  Los precios de los medicamentos varan con frecuencia dependiendo del Environmental consultant de dnde se surte la receta y alguna farmacias pueden ofrecer precios ms baratos.  El sitio web www.goodrx.com tiene cupones para medicamentos de Health and safety inspector. Los precios aqu no tienen en cuenta lo que podra costar con la ayuda del seguro (puede ser ms barato con su seguro), pero el sitio web puede darle el precio si no utiliz Tourist information centre manager.  - Puede imprimir el cupn correspondiente y llevarlo con su receta a la farmacia.  - Tambin puede pasar por nuestra oficina durante el horario de atencin regular y Education officer, museum una tarjeta de cupones de GoodRx.  -  Si necesita que su receta se enve electrnicamente a una farmacia diferente, informe a nuestra oficina a travs de MyChart de [place] o por telfono llamando al [phone] y presione la opcin 4.

## 2023-06-27 NOTE — Progress Notes (Signed)
 Follow-Up Visit   Subjective  Karla Martin is a 73 y.o. female who presents for the following: Skin Cancer Screening and Full Body Skin Exam  The patient presents for Total-Body Skin Exam (TBSE) for skin cancer screening and mole check. The patient has spots, moles and lesions to be evaluated, some may be new or changing and the patient may have concern these could be cancer.  No hx skin cancer. Patient with a spot at left shin, present for 1-2 years. Few spots at trunk that are irritated.   The following portions of the chart were reviewed this encounter and updated as appropriate: medications, allergies, medical history  Review of Systems:  No other skin or systemic complaints except as noted in HPI or Assessment and Plan.  Objective  Well appearing patient in no apparent distress; mood and affect are within normal limits.  A full examination was performed including scalp, head, eyes, ears, nose, lips, neck, chest, axillae, abdomen, back, buttocks, bilateral upper extremities, bilateral lower extremities, hands, feet, fingers, toes, fingernails, and toenails. All findings within normal limits unless otherwise noted below.   Relevant physical exam findings are noted in the Assessment and Plan.  L Postauricular x 1, L post axillary x 1, L inframammary x 11, R inframammary x 3, R back x 2 (18) Erythematous stuck-on, waxy papule or plaque left lateral pretibial Pink scaly patch    Left Inframammary x 15, R inframammary x 4 (19) Fleshy, skin-colored pedunculated papules.    Assessment & Plan   SKIN CANCER SCREENING PERFORMED TODAY.  ACTINIC DAMAGE - Chronic condition, secondary to cumulative UV/sun exposure - diffuse scaly erythematous macules with underlying dyspigmentation - Recommend daily broad spectrum sunscreen SPF 30+ to sun-exposed areas, reapply every 2 hours as needed.  - Staying in the shade or wearing long sleeves, sun glasses (UVA+UVB protection) and wide brim hats  (4-inch brim around the entire circumference of the hat) are also recommended for sun protection.  - Call for new or changing lesions.  LENTIGINES, SEBORRHEIC KERATOSES, HEMANGIOMAS - Benign normal skin lesions - Benign-appearing - Call for any changes  MELANOCYTIC NEVI - Tan-brown and/or pink-flesh-colored symmetric macules and papules - Benign appearing on exam today - Observation - Call clinic for new or changing moles - Recommend daily use of broad spectrum spf 30+ sunscreen to sun-exposed areas.   TELANGIECTASIA Exam: dilated blood vessel(s) at back  Treatment Plan: Benign appearing on exam Call for changes  INFLAMED SEBORRHEIC KERATOSIS (18) L Postauricular x 1, L post axillary x 1, L inframammary x 11, R inframammary x 3, R back x 2 (18) Symptomatic, irritating, patient would like treated.  Benign-appearing.  Call clinic for new or changing lesions.    Destruction of lesion - L Postauricular x 1, L post axillary x 1, L inframammary x 11, R inframammary x 3, R back x 2 (18) Complexity: simple   Destruction method: cryotherapy   Informed consent: discussed and consent obtained   Timeout:  patient name, date of birth, surgical site, and procedure verified Lesion destroyed using liquid nitrogen: Yes   Region frozen until ice ball extended beyond lesion: Yes   Outcome: patient tolerated procedure well with no complications   Post-procedure details: wound care instructions given   NEOPLASM OF UNCERTAIN BEHAVIOR OF SKIN left lateral pretibial Destruction of lesion Complexity: extensive   Destruction method: electrodesiccation and curettage   Informed consent: discussed and consent obtained   Timeout:  patient name, date of birth, surgical site,  and procedure verified Procedure prep:  Patient was prepped and draped in usual sterile fashion Prep type:  Isopropyl alcohol Anesthesia: the lesion was anesthetized in a standard fashion   Anesthetic:  1% lidocaine  w/  epinephrine 1-100,000 buffered w/ 8.4% NaHCO3 Curettage performed in three different directions: Yes   Electrodesiccation performed over the curetted area: Yes   Final wound size (cm):  1.2 Hemostasis achieved with:  pressure, aluminum chloride and electrodesiccation Outcome: patient tolerated procedure well with no complications   Post-procedure details: sterile dressing applied and wound care instructions given   Dressing type: bandage and petrolatum    Epidermal / dermal shaving  Lesion diameter (cm):  1.2 Informed consent: discussed and consent obtained   Timeout: patient name, date of birth, surgical site, and procedure verified   Procedure prep:  Patient was prepped and draped in usual sterile fashion Prep type:  Isopropyl alcohol Anesthesia: the lesion was anesthetized in a standard fashion   Anesthetic:  1% lidocaine  w/ epinephrine 1-100,000 buffered w/ 8.4% NaHCO3 Instrument used: flexible razor blade   Hemostasis achieved with: pressure, aluminum chloride and electrodesiccation   Outcome: patient tolerated procedure well   Post-procedure details: sterile dressing applied and wound care instructions given   Dressing type: bandage and petrolatum   Specimen 1 - Surgical pathology Differential Diagnosis: SCC vs Other  Check Margins: No Pink scaly patch Treated with EDC SKIN TAG (19) Left Inframammary x 15, R inframammary x 4 (19) Symptomatic, irritating, patient would like treated.  Benign-appearing.  Call clinic for new or changing lesions.   Destruction of lesion - Left Inframammary x 15, R inframammary x 4 (19) Complexity: simple   Destruction method: cryotherapy   Informed consent: discussed and consent obtained   Timeout:  patient name, date of birth, surgical site, and procedure verified Lesion destroyed using liquid nitrogen: Yes   Region frozen until ice ball extended beyond lesion: Yes   Outcome: patient tolerated procedure well with no complications    Post-procedure details: wound care instructions given   SKIN CANCER SCREENING   ACTINIC SKIN DAMAGE   SEBORRHEIC KERATOSIS   LENTIGO   MELANOCYTIC NEVUS, UNSPECIFIED LOCATION   TELANGIECTASIA   Return in about 1 year (around 06/26/2024) for TBSE, with Dr. Almeda Aris, RMA, am acting as scribe for Celine Collard, MD .   Documentation: I have reviewed the above documentation for accuracy and completeness, and I agree with the above.  Celine Collard, MD

## 2023-07-02 ENCOUNTER — Encounter: Payer: Self-pay | Admitting: Dermatology

## 2023-07-02 LAB — SURGICAL PATHOLOGY

## 2023-07-03 ENCOUNTER — Telehealth: Payer: Self-pay

## 2023-07-03 NOTE — Telephone Encounter (Addendum)
 Called and discussed results with patient. She verbalized understanding and denied further questions. Will recheck at next follow up ----- Message from Celine Collard sent at 07/02/2023  6:33 PM EDT ----- FINAL DIAGNOSIS        1. Skin, left lateral pretibial :       HYPERTROPHIC ACTINIC KERATOSIS   PreCancer  Already treated Recheck next visit

## 2023-07-19 ENCOUNTER — Other Ambulatory Visit: Payer: Self-pay | Admitting: Internal Medicine

## 2023-07-19 DIAGNOSIS — E782 Mixed hyperlipidemia: Secondary | ICD-10-CM

## 2023-08-18 ENCOUNTER — Encounter: Payer: Self-pay | Admitting: Internal Medicine

## 2023-08-19 ENCOUNTER — Other Ambulatory Visit: Payer: Self-pay

## 2023-08-19 DIAGNOSIS — I10 Essential (primary) hypertension: Secondary | ICD-10-CM

## 2023-08-19 DIAGNOSIS — R7303 Prediabetes: Secondary | ICD-10-CM

## 2023-08-19 DIAGNOSIS — E782 Mixed hyperlipidemia: Secondary | ICD-10-CM

## 2023-08-21 LAB — LIPID PANEL W/O CHOL/HDL RATIO
Cholesterol, Total: 167 mg/dL (ref 100–199)
HDL: 65 mg/dL (ref >39)
LDL Chol Calc (NIH): 91 mg/dL (ref 0–99)
Triglycerides: 53 mg/dL (ref 0–149)
VLDL Cholesterol Cal: 11 mg/dL (ref 5–40)

## 2023-08-21 LAB — CMP14+EGFR
ALT: 17 IU/L (ref 0–32)
AST: 15 IU/L (ref 0–40)
Albumin: 4.5 g/dL (ref 3.8–4.8)
Alkaline Phosphatase: 66 IU/L (ref 44–121)
BUN/Creatinine Ratio: 22 (ref 12–28)
BUN: 18 mg/dL (ref 8–27)
Bilirubin Total: 0.3 mg/dL (ref 0.0–1.2)
CO2: 21 mmol/L (ref 20–29)
Calcium: 9.7 mg/dL (ref 8.7–10.3)
Chloride: 105 mmol/L (ref 96–106)
Creatinine, Ser: 0.81 mg/dL (ref 0.57–1.00)
Globulin, Total: 1.9 g/dL (ref 1.5–4.5)
Glucose: 99 mg/dL (ref 70–99)
Potassium: 4.2 mmol/L (ref 3.5–5.2)
Sodium: 142 mmol/L (ref 134–144)
Total Protein: 6.4 g/dL (ref 6.0–8.5)
eGFR: 77 mL/min/{1.73_m2} (ref >59)

## 2023-08-21 LAB — HEMOGLOBIN A1C
Est. average glucose Bld gHb Est-mCnc: 117 mg/dL
Hgb A1c MFr Bld: 5.7 % — ABNORMAL HIGH (ref 4.8–5.6)

## 2023-08-22 ENCOUNTER — Ambulatory Visit: Payer: Self-pay | Admitting: Internal Medicine

## 2023-08-26 ENCOUNTER — Ambulatory Visit (INDEPENDENT_AMBULATORY_CARE_PROVIDER_SITE_OTHER): Admitting: Internal Medicine

## 2023-08-26 ENCOUNTER — Encounter: Payer: Self-pay | Admitting: Internal Medicine

## 2023-08-26 VITALS — BP 130/70 | HR 65 | Ht 64.0 in | Wt 157.6 lb

## 2023-08-26 DIAGNOSIS — I10 Essential (primary) hypertension: Secondary | ICD-10-CM | POA: Diagnosis not present

## 2023-08-26 DIAGNOSIS — E782 Mixed hyperlipidemia: Secondary | ICD-10-CM

## 2023-08-26 DIAGNOSIS — R7303 Prediabetes: Secondary | ICD-10-CM

## 2023-08-26 MED ORDER — ROSUVASTATIN CALCIUM 10 MG PO TABS: 10.0000 mg | ORAL_TABLET | Freq: Every day | ORAL | 3 refills | Status: AC

## 2023-08-26 NOTE — Progress Notes (Signed)
 Established Patient Office Visit  Subjective:  Patient ID: Karla Martin, female    DOB: [birth year]  Age: 73 y.o. MRN: [id number]  Chief Complaint  Patient presents with   Follow-up    3 month follow up    Patient comes in for follow-up today.  She is generally feeling well and has no new complaints.  Recently had labs which showed an LDL of 91.  She is concerned that her HDL has dropped on simvastatin  20 mg/day.  Will switch to Crestor 10 mg/day.  Mammogram needs to be scheduled.    No other concerns at this time.   Past Medical History:  Diagnosis Date   GERD (gastroesophageal reflux disease)    History of actinic keratosis 06/27/2023   bx proven ak at left lateral pretibial - already treated will recheck at follow up   Hypercholesteremia    Hypertension     Past Surgical History:  Procedure Laterality Date   BREAST SURGERY     fibroid tumor   COLONOSCOPY WITH PROPOFOL  N/A 01/19/2019   Procedure: COLONOSCOPY WITH PROPOFOL ;  Surgeon: Toledo, Ladell MARLA, MD;  Location: [facility] ENDOSCOPY;  Service: Gastroenterology;  Laterality: N/A;   colonoscopy x3     UTERINE FIBROID SURGERY      Social History   Socioeconomic History   Marital status: Single    Spouse name: Not on file   Number of children: Not on file   Years of education: Not on file   Highest education level: Not on file  Occupational History   Not on file  Tobacco Use   Smoking status: Never   Smokeless tobacco: Never  Vaping Use   Vaping status: Never Used  Substance and Sexual Activity   Alcohol use: Yes    Alcohol/week: 0.0 standard drinks of alcohol   Drug use: Never   Sexual activity: Yes  Other Topics Concern   Not on file  Social History Narrative   Not on file   Social Drivers of Health   Financial Resource Strain: Not on file  Food Insecurity: No Food Insecurity (02/22/2023)   Hunger Vital Sign    Worried About Running Out of Food in the Last Year: Never true    Ran Out of Food in the  Last Year: Never true  Transportation Needs: No Transportation Needs (02/22/2023)   PRAPARE - Administrator, Civil Service (Medical): No    Lack of Transportation (Non-Medical): No  Physical Activity: Not on file  Stress: No Stress Concern Present (02/22/2023)   [employer] of Occupational Health - Occupational Stress Questionnaire    Feeling of Stress : Not at all  Social Connections: Not on file  Intimate Partner Violence: Not At Risk (02/22/2023)   Humiliation, Afraid, Rape, and Kick questionnaire    Fear of Current or Ex-Partner: No    Emotionally Abused: No    Physically Abused: No    Sexually Abused: No    History reviewed. No pertinent family history.  Allergies  Allergen Reactions   Erythromycin    Penicillins     Outpatient Medications Prior to Visit  Medication Sig   amLODipine  (NORVASC ) 5 MG tablet TAKE 1 TABLET (5 MG TOTAL) BY MOUTH DAILY.   Ascorbic Acid (VITAMIN C) 1000 MG tablet Take by mouth.   calcium-vitamin D (OSCAL WITH D) 500-200 MG-UNIT tablet Take by mouth.   polycarbophil (FIBERCON) 625 MG tablet Take 625 mg by mouth daily.   [DISCONTINUED] simvastatin  (ZOCOR ) 20 MG tablet  TAKE 1 TABLET BY MOUTH EVERYDAY AT BEDTIME   No facility-administered medications prior to visit.    Review of Systems  Constitutional: Negative.  Negative for chills, fever, malaise/fatigue and weight loss.  HENT: Negative.  Negative for sore throat.   Eyes: Negative.   Respiratory: Negative.  Negative for cough and shortness of breath.   Cardiovascular: Negative.  Negative for chest pain, palpitations and leg swelling.  Gastrointestinal: Negative.  Negative for abdominal pain, constipation, diarrhea, heartburn, nausea and vomiting.  Genitourinary: Negative.  Negative for dysuria and flank pain.  Musculoskeletal: Negative.  Negative for joint pain and myalgias.  Skin: Negative.   Neurological: Negative.  Negative for dizziness, tingling, tremors and  headaches.  Endo/Heme/Allergies: Negative.   Psychiatric/Behavioral: Negative.  Negative for depression and suicidal ideas. The patient is not nervous/anxious.        Objective:   BP 130/70   Pulse 65   Ht 5' 4 (1.626 m)   Wt 157 lb 9.6 oz (71.5 kg)   SpO2 96%   BMI 27.05 kg/m   Vitals:   08/26/23 0913  BP: 130/70  Pulse: 65  Height: 5' 4 (1.626 m)  Weight: 157 lb 9.6 oz (71.5 kg)  SpO2: 96%  BMI (Calculated): 27.04    Physical Exam Vitals and nursing note reviewed.  Constitutional:      Appearance: Normal appearance.  HENT:     Head: Normocephalic and atraumatic.     Nose: Nose normal.     Mouth/Throat:     Mouth: Mucous membranes are moist.     Pharynx: Oropharynx is clear.   Eyes:     Conjunctiva/sclera: Conjunctivae normal.     Pupils: Pupils are equal, round, and reactive to light.    Cardiovascular:     Rate and Rhythm: Normal rate and regular rhythm.     Pulses: Normal pulses.     Heart sounds: Normal heart sounds. No murmur heard. Pulmonary:     Effort: Pulmonary effort is normal.     Breath sounds: Normal breath sounds. No wheezing.  Abdominal:     General: Bowel sounds are normal.     Palpations: Abdomen is soft.     Tenderness: There is no abdominal tenderness. There is no right CVA tenderness or left CVA tenderness.   Musculoskeletal:        General: Normal range of motion.     Cervical back: Normal range of motion.     Right lower leg: No edema.     Left lower leg: No edema.   Skin:    General: Skin is warm and dry.   Neurological:     General: No focal deficit present.     Mental Status: She is alert and oriented to person, place, and time.   Psychiatric:        Mood and Affect: Mood normal.        Behavior: Behavior normal.      No results found for any visits on 08/26/23.  Recent Results (from the past 2160 hours)  Surgical pathology     Status: None   Collection Time: 06/27/23 12:00 AM  Result Value Ref Range    SURGICAL PATHOLOGY      SURGICAL PATHOLOGY [facility] [address], [address] [place], [place] [address] Telephone [phone] or [phone] Fax [phone]  REPORT OF DERMATOPATHOLOGY   Accession #: [id number] Patient Name: JAUNITA, MIKELS Visit # : [id number]  MRN: [id number] Cytotechnologist: Depcik-Smith Md, Laneta, Dermatopathologist,  Electronic Signature DOB/Age [birth year] (Age: 62) Gender: F Collected Date: 06/27/2023 Received Date: 06/27/2023  FINAL DIAGNOSIS       1. Skin, left lateral pretibial :       HYPERTROPHIC ACTINIC KERATOSIS       DATE SIGNED OUT: 07/02/2023 ELECTRONIC SIGNATURE : Depcik-Smith Md, Natalie, Dermatopathologist, Electronic Signature  MICROSCOPIC DESCRIPTION 1. There is moderate to marked keratinocytic atypia associated with acanthosis.  Underlying solar elastosis is seen.  No diagnostic infiltrating tumor is noted.  The papillary dermis has a prominent infiltrate of chronic inflammatory cells and there is focal keratino cyte necrosis in the basal layer.  CASE COMMENTS STAINS USED IN DIAGNOSIS: H&E    CLINICAL HISTORY  SPECIMEN(S) OBTAINED 1. Skin, Left Lateral Pretibial  SPECIMEN COMMENTS: 1. Pink scaly patch, treated with EDC SPECIMEN CLINICAL INFORMATION: 1. Neoplasm of uncertain behavior of skin, SCC vs other    Gross Description 1. Formalin fixed specimen received:  15 X 12 X 1 MM, TOTO (5 P) (1 B) ( erh )        Report signed out from the following location(s) [facility] [address] [place], [place] [address] CLIA #: [id number]  [facility] [address] [place], [place] [address] CLIA #: [id number]   Hemoglobin A1c     Status: Abnormal   Collection Time: 08/20/23  7:08 AM  Result Value Ref Range   Hgb A1c MFr Bld 5.7 (H) 4.8 - 5.6 %    Comment:          Prediabetes: 5.7 - 6.4          Diabetes: >6.4          Glycemic control for adults  with diabetes: <7.0    Est. average glucose Bld gHb Est-mCnc 117 mg/dL  Lipid Panel w/o Chol/HDL Ratio     Status: None   Collection Time: 08/20/23  7:08 AM  Result Value Ref Range   Cholesterol, Total 167 100 - 199 mg/dL   Triglycerides 53 0 - 149 mg/dL   HDL 65 >60 mg/dL   VLDL Cholesterol Cal 11 5 - 40 mg/dL   LDL Chol Calc (NIH) 91 0 - 99 mg/dL  [id number]     Status: None   Collection Time: 08/20/23  7:08 AM  Result Value Ref Range   Glucose 99 70 - 99 mg/dL   BUN 18 8 - 27 mg/dL   Creatinine, Ser 9.18 0.57 - 1.00 mg/dL   eGFR 77 >40 fO/fpw/8.26   BUN/Creatinine Ratio 22 12 - 28   Sodium 142 134 - 144 mmol/L   Potassium 4.2 3.5 - 5.2 mmol/L   Chloride 105 96 - 106 mmol/L   CO2 21 20 - 29 mmol/L   Calcium 9.7 8.7 - 10.3 mg/dL   Total Protein 6.4 6.0 - 8.5 g/dL   Albumin 4.5 3.8 - 4.8 g/dL   Globulin, Total 1.9 1.5 - 4.5 g/dL   Bilirubin Total 0.3 0.0 - 1.2 mg/dL   Alkaline Phosphatase 66 44 - 121 IU/L   AST 15 0 - 40 IU/L   ALT 17 0 - 32 IU/L      Assessment & Plan:  Patient will continue her medications.  Also to continue diet and exercise program.  Switch to Crestor from simvastatin . [facility] at [facility]. Problem List Items Addressed This Visit     Essential hypertension, benign - Primary   Relevant Medications   rosuvastatin (CRESTOR) 10 MG tablet  Mixed hyperlipidemia   Relevant Medications   rosuvastatin (CRESTOR) 10 MG tablet   Prediabetes    Return in about 6 months (around 02/25/2024).   Total time spent: 30 minutes  FERNAND FREDY RAMAN, MD  08/26/2023   This document may have been prepared by [facility] Voice Recognition software and as such may include unintentional dictation errors.

## 2023-09-23 ENCOUNTER — Ambulatory Visit (INDEPENDENT_AMBULATORY_CARE_PROVIDER_SITE_OTHER)

## 2023-09-23 ENCOUNTER — Ambulatory Visit (INDEPENDENT_AMBULATORY_CARE_PROVIDER_SITE_OTHER): Admitting: Podiatry

## 2023-09-23 VITALS — Ht 64.0 in | Wt 157.6 lb

## 2023-09-23 DIAGNOSIS — S90121A Contusion of right lesser toe(s) without damage to nail, initial encounter: Secondary | ICD-10-CM | POA: Diagnosis not present

## 2023-09-23 DIAGNOSIS — M79671 Pain in right foot: Secondary | ICD-10-CM

## 2023-09-23 DIAGNOSIS — M7751 Other enthesopathy of right foot: Secondary | ICD-10-CM

## 2023-09-23 NOTE — Progress Notes (Signed)
  Subjective:  Patient ID: Karla Martin, female    DOB: 03-01-1951,  MRN: [id number]  Chief Complaint  Patient presents with   Toe Injury    Rm 6 Patient is her right fifth toe injury. Patient states injuring right fifth toe on September 06, 2023. Patient is currently only wearing tennis shoes for comfort.    Discussed the use of AI scribe software for clinical note transcription with the patient, who gave verbal consent to proceed.  History of Present Illness Karla Martin is a 73 year old female who presents with a swollen and red right fifth toe after stubbing it.  She stubbed her right fifth toe around the July Fourth weekend, resulting in swelling and redness. There is no bruising. She has no history of gout.  She has been wearing her Skechers and walking frequently, which she believes has alleviated the soreness. There is no dislocation or abnormal positioning of the toe post-injury.  She has a history of breaking at least one of her baby toes previously due to someone stepping on it, but she does not believe this current injury is as severe.      Objective:    Physical Exam VASCULAR: DP and PT pulse palpable. Foot is warm and well-perfused. Capillary fill time is brisk. DERMATOLOGIC: Normal skin turgor, texture, and temperature. No open lesions, rashes, or ulcerations. NEUROLOGIC: Normal sensation to light touch and pressure. No paresthesias on examination. ORTHOPEDIC: Right fifth toe swollen with erythema. Smooth pain-free range of motion of all examined joints. No ecchymosis or bruising. No gross deformity. No pain to palpation of right fifth toe bone or interphalangeal joint.   No images are attached to the encounter.    Results RADIOLOGY Toe X-ray: No fracture detected   Assessment:   1. Contusion of fifth toe of right foot, initial encounter      Plan:  Patient was evaluated and treated and all questions answered.  Assessment and Plan Assessment & Plan Toe  injury to the right fifth toe The injury occurred around July Fourth weekend, presenting with swelling and erythema but no bruising or pain upon palpation of the bone or interphalangeal joint. X-ray shows no definitive fracture. Likely due to jamming of the cartilage in the joint, expected to improve over time with potential for future arthritis. Regular activity should not worsen the condition, and full healing is anticipated in about a month. - Advise wearing regular shoes. - Apply ice pack to the toe if erythema and swelling persist at the end of the day. - Contact office for follow-up if pain or issues persist by Labor Day.      Return if symptoms worsen or fail to improve.

## 2023-10-02 ENCOUNTER — Encounter: Payer: Self-pay | Admitting: Internal Medicine

## 2023-10-28 ENCOUNTER — Ambulatory Visit (INDEPENDENT_AMBULATORY_CARE_PROVIDER_SITE_OTHER): Admitting: Internal Medicine

## 2023-10-28 ENCOUNTER — Encounter: Payer: Self-pay | Admitting: Internal Medicine

## 2023-10-28 VITALS — BP 126/78 | HR 80 | Ht 64.0 in | Wt 160.0 lb

## 2023-10-28 DIAGNOSIS — E782 Mixed hyperlipidemia: Secondary | ICD-10-CM | POA: Diagnosis not present

## 2023-10-28 DIAGNOSIS — R7303 Prediabetes: Secondary | ICD-10-CM | POA: Diagnosis not present

## 2023-10-28 DIAGNOSIS — I10 Essential (primary) hypertension: Secondary | ICD-10-CM | POA: Diagnosis not present

## 2023-10-28 DIAGNOSIS — H00011 Hordeolum externum right upper eyelid: Secondary | ICD-10-CM | POA: Diagnosis not present

## 2023-10-28 MED ORDER — DOXYCYCLINE HYCLATE 100 MG PO TABS: 100.0000 mg | ORAL_TABLET | Freq: Two times a day (BID) | ORAL | 0 refills | Status: AC

## 2023-10-28 NOTE — Progress Notes (Signed)
 Established Patient Office Visit  Subjective:  Patient ID: Karla Martin, female    DOB: 1950/07/07  Age: 73 y.o. MRN: [id number]  Chief Complaint  Patient presents with   Eye Problem    Patient reports stye for 1 month on R upper eyelid. Her eye dr encouraged warm compresses three times a day; patient reports it ruptured yesterday but is still warm, hard, and has a pustule head.   Eye Problem  Pertinent negatives include no nausea or vomiting.    No other concerns at this time.   Past Medical History:  Diagnosis Date   GERD (gastroesophageal reflux disease)    History of actinic keratosis 06/27/2023   bx proven ak at left lateral pretibial - already treated will recheck at follow up   Hypercholesteremia    Hypertension     Past Surgical History:  Procedure Laterality Date   BREAST SURGERY     fibroid tumor   COLONOSCOPY WITH PROPOFOL  N/A 01/19/2019   Procedure: COLONOSCOPY WITH PROPOFOL ;  Surgeon: Toledo, Ladell MARLA, MD;  Location: [facility];  Service: Gastroenterology;  Laterality: N/A;   colonoscopy x3     UTERINE FIBROID SURGERY      Social History   Socioeconomic History   Marital status: Single    Spouse name: Not on file   Number of children: Not on file   Years of education: Not on file   Highest education level: Not on file  Occupational History   Not on file  Tobacco Use   Smoking status: Never   Smokeless tobacco: Never  Vaping Use   Vaping status: Never Used  Substance and Sexual Activity   Alcohol use: Yes    Alcohol/week: 0.0 standard drinks of alcohol   Drug use: Never   Sexual activity: Yes  Other Topics Concern   Not on file  Social History Narrative   Not on file   Social Drivers of Health   Financial Resource Strain: Not on file  Food Insecurity: No Food Insecurity (02/22/2023)   Hunger Vital Sign    Worried About Running Out of Food in the Last Year: Never true    Ran Out of Food in the Last Year: Never true   Transportation Needs: No Transportation Needs (02/22/2023)   PRAPARE - Administrator, Civil Service (Medical): No    Lack of Transportation (Non-Medical): No  Physical Activity: Not on file  Stress: No Stress Concern Present (02/22/2023)   [employer] of Occupational Health - Occupational Stress Questionnaire    Feeling of Stress : Not at all  Social Connections: Not on file  Intimate Partner Violence: Not At Risk (02/22/2023)   Humiliation, Afraid, Rape, and Kick questionnaire    Fear of Current or Ex-Partner: No    Emotionally Abused: No    Physically Abused: No    Sexually Abused: No    History reviewed. No pertinent family history.  Allergies  Allergen Reactions   Erythromycin    Penicillins     Outpatient Medications Prior to Visit  Medication Sig   amLODipine  (NORVASC ) 5 MG tablet TAKE 1 TABLET (5 MG TOTAL) BY MOUTH DAILY.   Ascorbic Acid (VITAMIN C) 1000 MG tablet Take by mouth.   calcium -vitamin D (OSCAL WITH D) 500-200 MG-UNIT tablet Take by mouth.   rosuvastatin  (CRESTOR ) 10 MG tablet Take 1 tablet (10 mg total) by mouth daily.   polycarbophil (FIBERCON) 625 MG tablet Take 625 mg by mouth daily.   No facility-administered  medications prior to visit.    Review of Systems  Constitutional: Negative.   HENT: Negative.    Eyes:  Positive for pain (stye on R upper eyelid).  Respiratory: Negative.  Negative for cough and shortness of breath.   Cardiovascular: Negative.  Negative for chest pain, palpitations and leg swelling.  Gastrointestinal: Negative.  Negative for abdominal pain, constipation, diarrhea, heartburn, nausea and vomiting.  Genitourinary: Negative.  Negative for dysuria and flank pain.  Musculoskeletal: Negative.  Negative for joint pain and myalgias.  Skin: Negative.   Neurological: Negative.  Negative for dizziness and headaches.  Endo/Heme/Allergies: Negative.   Psychiatric/Behavioral: Negative.  Negative for depression and  suicidal ideas. The patient is not nervous/anxious.        Objective:   BP 126/78   Pulse 80   Ht 5' 4 (1.626 m)   Wt 160 lb (72.6 kg)   SpO2 98%   BMI 27.46 kg/m   Vitals:   10/28/23 1439  BP: 126/78  Pulse: 80  Height: 5' 4 (1.626 m)  Weight: 160 lb (72.6 kg)  SpO2: 98%  BMI (Calculated): 27.45    Physical Exam Vitals and nursing note reviewed.  Constitutional:      Appearance: Normal appearance.  HENT:     Head: Normocephalic and atraumatic.     Nose: Nose normal.     Mouth/Throat:     Mouth: Mucous membranes are moist.     Pharynx: Oropharynx is clear.  Eyes:     Conjunctiva/sclera: Conjunctivae normal.     Pupils: Pupils are equal, round, and reactive to light.  Cardiovascular:     Rate and Rhythm: Normal rate and regular rhythm.     Pulses: Normal pulses.     Heart sounds: Normal heart sounds. No murmur heard. Pulmonary:     Effort: Pulmonary effort is normal.     Breath sounds: Normal breath sounds. No wheezing.  Abdominal:     General: Bowel sounds are normal.     Palpations: Abdomen is soft.     Tenderness: There is no abdominal tenderness. There is no right CVA tenderness or left CVA tenderness.  Musculoskeletal:        General: Normal range of motion.     Cervical back: Normal range of motion.     Right lower leg: No edema.     Left lower leg: No edema.  Skin:    General: Skin is warm and dry.  Neurological:     General: No focal deficit present.     Mental Status: She is alert and oriented to person, place, and time.  Psychiatric:        Mood and Affect: Mood normal.        Behavior: Behavior normal.      No results found for any visits on 10/28/23.  Recent Results (from the past 2160 hours)  Hemoglobin A1c     Status: Abnormal   Collection Time: 08/20/23  7:08 AM  Result Value Ref Range   Hgb A1c MFr Bld 5.7 (H) 4.8 - 5.6 %    Comment:          Prediabetes: 5.7 - 6.4          Diabetes: >6.4          Glycemic control for adults  with diabetes: <7.0    Est. average glucose Bld gHb Est-mCnc 117 mg/dL  Lipid Panel w/o Chol/HDL Ratio     Status: None   Collection Time: 08/20/23  7:08  AM  Result Value Ref Range   Cholesterol, Total 167 100 - 199 mg/dL   Triglycerides 53 0 - 149 mg/dL   HDL 65 >60 mg/dL   VLDL Cholesterol Cal 11 5 - 40 mg/dL   LDL Chol Calc (NIH) 91 0 - 99 mg/dL  [id number]     Status: None   Collection Time: 08/20/23  7:08 AM  Result Value Ref Range   Glucose 99 70 - 99 mg/dL   BUN 18 8 - 27 mg/dL   Creatinine, Ser 9.18 0.57 - 1.00 mg/dL   eGFR 77 >40 fO/fpw/8.26   BUN/Creatinine Ratio 22 12 - 28   Sodium 142 134 - 144 mmol/L   Potassium 4.2 3.5 - 5.2 mmol/L   Chloride 105 96 - 106 mmol/L   CO2 21 20 - 29 mmol/L   Calcium  9.7 8.7 - 10.3 mg/dL   Total Protein 6.4 6.0 - 8.5 g/dL   Albumin 4.5 3.8 - 4.8 g/dL   Globulin, Total 1.9 1.5 - 4.5 g/dL   Bilirubin Total 0.3 0.0 - 1.2 mg/dL   Alkaline Phosphatase 66 44 - 121 IU/L   AST 15 0 - 40 IU/L   ALT 17 0 - 32 IU/L      Assessment & Plan:  Start Doxycycline  100 mg twice a day for 7 days. Will send referral to ophthalmologist for I&D if not resolved with antibiotics.  Problem List Items Addressed This Visit   None Visit Diagnoses       Hordeolum externum of right upper eyelid    -  Primary   Relevant Medications   doxycycline  (VIBRA -TABS) 100 MG tablet   Other Relevant Orders   Ambulatory referral to Ophthalmology       No follow-ups on file.   Total time spent: 15 minutes  FERNAND FREDY RAMAN, MD  10/28/2023   This document may have been prepared by [facility] Voice Recognition software and as such may include unintentional dictation errors.

## 2023-10-28 NOTE — Progress Notes (Signed)
 Established Patient Office Visit  Subjective:  Patient ID: Karla Martin, female    DOB: [birth year]  Age: 73 y.o. MRN: [id number]  Chief Complaint  Patient presents with   Eye Problem    Patient comes in with c/o right upper eyelid pain and swelling, started few days ago. She was seen by her optometrist and advised warm compresses, which eventually caused release of some material . However she continues to feel a bump on the inside, and residual swelling and redness on upper eyelid. No visual problems. Will start po Doxycycline - and set up urgent referral for possible I&D.    No other concerns at this time.   Past Medical History:  Diagnosis Date   GERD (gastroesophageal reflux disease)    History of actinic keratosis 06/27/2023   bx proven ak at left lateral pretibial - already treated will recheck at follow up   Hypercholesteremia    Hypertension     Past Surgical History:  Procedure Laterality Date   BREAST SURGERY     fibroid tumor   COLONOSCOPY WITH PROPOFOL  N/A 01/19/2019   Procedure: COLONOSCOPY WITH PROPOFOL ;  Surgeon: Toledo, Ladell MARLA, MD;  Location: [facility];  Service: Gastroenterology;  Laterality: N/A;   colonoscopy x3     UTERINE FIBROID SURGERY      Social History   Socioeconomic History   Marital status: Single    Spouse name: Not on file   Number of children: Not on file   Years of education: Not on file   Highest education level: Not on file  Occupational History   Not on file  Tobacco Use   Smoking status: Never   Smokeless tobacco: Never  Vaping Use   Vaping status: Never Used  Substance and Sexual Activity   Alcohol use: Yes    Alcohol/week: 0.0 standard drinks of alcohol   Drug use: Never   Sexual activity: Yes  Other Topics Concern   Not on file  Social History Narrative   Not on file   Social Drivers of Health   Financial Resource Strain: Not on file  Food Insecurity: No Food Insecurity (02/22/2023)   Hunger Vital Sign     Worried About Running Out of Food in the Last Year: Never true    Ran Out of Food in the Last Year: Never true  Transportation Needs: No Transportation Needs (02/22/2023)   PRAPARE - Administrator, Civil Service (Medical): No    Lack of Transportation (Non-Medical): No  Physical Activity: Not on file  Stress: No Stress Concern Present (02/22/2023)   [employer] of Occupational Health - Occupational Stress Questionnaire    Feeling of Stress : Not at all  Social Connections: Not on file  Intimate Partner Violence: Not At Risk (02/22/2023)   Humiliation, Afraid, Rape, and Kick questionnaire    Fear of Current or Ex-Partner: No    Emotionally Abused: No    Physically Abused: No    Sexually Abused: No    History reviewed. No pertinent family history.  Allergies  Allergen Reactions   Erythromycin    Penicillins     Outpatient Medications Prior to Visit  Medication Sig   amLODipine  (NORVASC ) 5 MG tablet TAKE 1 TABLET (5 MG TOTAL) BY MOUTH DAILY.   Ascorbic Acid (VITAMIN C) 1000 MG tablet Take by mouth.   calcium -vitamin D (OSCAL WITH D) 500-200 MG-UNIT tablet Take by mouth.   rosuvastatin  (CRESTOR ) 10 MG tablet Take 1 tablet (10 mg total) by  mouth daily.   polycarbophil (FIBERCON) 625 MG tablet Take 625 mg by mouth daily.   No facility-administered medications prior to visit.    Review of Systems  Constitutional: Negative.  Negative for chills, diaphoresis and fever.  HENT: Negative.  Negative for sore throat.   Eyes: Negative.  Negative for blurred vision, double vision, photophobia, pain, discharge and redness.  Respiratory: Negative.  Negative for cough and shortness of breath.   Cardiovascular: Negative.  Negative for chest pain, palpitations and leg swelling.  Gastrointestinal: Negative.  Negative for abdominal pain, constipation, diarrhea, heartburn, nausea and vomiting.  Genitourinary: Negative.  Negative for dysuria and flank pain.   Musculoskeletal: Negative.  Negative for joint pain and myalgias.  Skin: Negative.   Neurological: Negative.  Negative for dizziness and headaches.  Endo/Heme/Allergies: Negative.   Psychiatric/Behavioral: Negative.  Negative for depression and suicidal ideas. The patient is not nervous/anxious.        Objective:   BP 126/78   Pulse 80   Ht 5' 4 (1.626 m)   Wt 160 lb (72.6 kg)   SpO2 98%   BMI 27.46 kg/m   Vitals:   10/28/23 1439  BP: 126/78  Pulse: 80  Height: 5' 4 (1.626 m)  Weight: 160 lb (72.6 kg)  SpO2: 98%  BMI (Calculated): 27.45    Physical Exam   No results found for any visits on 10/28/23.  Recent Results (from the past 2160 hours)  Hemoglobin A1c     Status: Abnormal   Collection Time: 08/20/23  7:08 AM  Result Value Ref Range   Hgb A1c MFr Bld 5.7 (H) 4.8 - 5.6 %    Comment:          Prediabetes: 5.7 - 6.4          Diabetes: >6.4          Glycemic control for adults with diabetes: <7.0    Est. average glucose Bld gHb Est-mCnc 117 mg/dL  Lipid Panel w/o Chol/HDL Ratio     Status: None   Collection Time: 08/20/23  7:08 AM  Result Value Ref Range   Cholesterol, Total 167 100 - 199 mg/dL   Triglycerides 53 0 - 149 mg/dL   HDL 65 >60 mg/dL   VLDL Cholesterol Cal 11 5 - 40 mg/dL   LDL Chol Calc (NIH) 91 0 - 99 mg/dL  [id number]     Status: None   Collection Time: 08/20/23  7:08 AM  Result Value Ref Range   Glucose 99 70 - 99 mg/dL   BUN 18 8 - 27 mg/dL   Creatinine, Ser 9.18 0.57 - 1.00 mg/dL   eGFR 77 >40 fO/fpw/8.26   BUN/Creatinine Ratio 22 12 - 28   Sodium 142 134 - 144 mmol/L   Potassium 4.2 3.5 - 5.2 mmol/L   Chloride 105 96 - 106 mmol/L   CO2 21 20 - 29 mmol/L   Calcium  9.7 8.7 - 10.3 mg/dL   Total Protein 6.4 6.0 - 8.5 g/dL   Albumin 4.5 3.8 - 4.8 g/dL   Globulin, Total 1.9 1.5 - 4.5 g/dL   Bilirubin Total 0.3 0.0 - 1.2 mg/dL   Alkaline Phosphatase 66 44 - 121 IU/L   AST 15 0 - 40 IU/L   ALT 17 0 - 32 IU/L      Assessment &  Plan:  Continue warm compresses. Po Doxycycline . Eye consult. Problem List Items Addressed This Visit     Essential hypertension, benign   Mixed hyperlipidemia  Prediabetes   Other Visit Diagnoses       Hordeolum externum of right upper eyelid    -  Primary   Relevant Medications   doxycycline  (VIBRA -TABS) 100 MG tablet   Other Relevant Orders   Ambulatory referral to Ophthalmology       Follow up as scheduled.   Total time spent: 25 minutes  FERNAND FREDY RAMAN, MD  10/28/2023   This document may have been prepared by [facility] Voice Recognition software and as such may include unintentional dictation errors.

## 2023-11-19 ENCOUNTER — Encounter: Payer: Self-pay | Admitting: Internal Medicine

## 2024-02-10 ENCOUNTER — Encounter: Payer: Self-pay | Admitting: Internal Medicine

## 2024-02-10 DIAGNOSIS — R7303 Prediabetes: Secondary | ICD-10-CM

## 2024-02-10 DIAGNOSIS — I10 Essential (primary) hypertension: Secondary | ICD-10-CM

## 2024-02-10 DIAGNOSIS — M8580 Other specified disorders of bone density and structure, unspecified site: Secondary | ICD-10-CM

## 2024-02-10 DIAGNOSIS — E782 Mixed hyperlipidemia: Secondary | ICD-10-CM

## 2024-02-10 DIAGNOSIS — E559 Vitamin D deficiency, unspecified: Secondary | ICD-10-CM

## 2024-02-13 ENCOUNTER — Ambulatory Visit: Payer: Self-pay | Admitting: Internal Medicine

## 2024-02-13 LAB — CMP14+EGFR
ALT: 21 IU/L (ref 0–32)
AST: 21 IU/L (ref 0–40)
Albumin: 4.4 g/dL (ref 3.8–4.8)
Alkaline Phosphatase: 66 IU/L (ref 49–135)
BUN/Creatinine Ratio: 17 (ref 12–28)
BUN: 14 mg/dL (ref 8–27)
Bilirubin Total: 0.4 mg/dL (ref 0.0–1.2)
CO2: 23 mmol/L (ref 20–29)
Calcium: 9.6 mg/dL (ref 8.7–10.3)
Chloride: 106 mmol/L (ref 96–106)
Creatinine, Ser: 0.83 mg/dL (ref 0.57–1.00)
Globulin, Total: 2 g/dL (ref 1.5–4.5)
Glucose: 98 mg/dL (ref 70–99)
Potassium: 4.6 mmol/L (ref 3.5–5.2)
Sodium: 141 mmol/L (ref 134–144)
Total Protein: 6.4 g/dL (ref 6.0–8.5)
eGFR: 74 mL/min/1.73 (ref >59)

## 2024-02-13 LAB — CBC WITH DIFFERENTIAL/PLATELET
Basophils Absolute: 0 x10E3/uL (ref 0.0–0.2)
Basos: 1 %
EOS (ABSOLUTE): 0.2 x10E3/uL (ref 0.0–0.4)
Eos: 3 %
Hematocrit: 39.9 % (ref 34.0–46.6)
Hemoglobin: 12.7 g/dL (ref 11.1–15.9)
Immature Grans (Abs): 0 x10E3/uL (ref 0.0–0.1)
Immature Granulocytes: 0 %
Lymphocytes Absolute: 1.7 x10E3/uL (ref 0.7–3.1)
Lymphs: 35 %
MCH: 29.7 pg (ref 26.6–33.0)
MCHC: 31.8 g/dL (ref 31.5–35.7)
MCV: 93 fL (ref 79–97)
Monocytes Absolute: 0.4 x10E3/uL (ref 0.1–0.9)
Monocytes: 8 %
Neutrophils Absolute: 2.6 x10E3/uL (ref 1.4–7.0)
Neutrophils: 53 %
Platelets: 227 x10E3/uL (ref 150–450)
RBC: 4.28 x10E6/uL (ref 3.77–5.28)
RDW: 13 % (ref 11.7–15.4)
WBC: 4.9 x10E3/uL (ref 3.4–10.8)

## 2024-02-13 LAB — LIPID PANEL
Chol/HDL Ratio: 1.9 ratio (ref 0.0–4.4)
Cholesterol, Total: 162 mg/dL (ref 100–199)
HDL: 87 mg/dL (ref >39)
LDL Chol Calc (NIH): 64 mg/dL (ref 0–99)
Triglycerides: 51 mg/dL (ref 0–149)
VLDL Cholesterol Cal: 11 mg/dL (ref 5–40)

## 2024-02-13 LAB — VITAMIN D 25 HYDROXY (VIT D DEFICIENCY, FRACTURES): Vit D, 25-Hydroxy: 41.6 ng/mL (ref 30.0–100.0)

## 2024-02-13 LAB — HEMOGLOBIN A1C
Est. average glucose Bld gHb Est-mCnc: 111 mg/dL
Hgb A1c MFr Bld: 5.5 % (ref 4.8–5.6)

## 2024-02-21 ENCOUNTER — Encounter: Payer: Self-pay | Admitting: Internal Medicine

## 2024-02-21 ENCOUNTER — Ambulatory Visit: Admitting: Internal Medicine

## 2024-02-21 VITALS — BP 138/78 | HR 85 | Ht 64.0 in | Wt 165.2 lb

## 2024-02-21 DIAGNOSIS — Z13820 Encounter for screening for osteoporosis: Secondary | ICD-10-CM | POA: Diagnosis not present

## 2024-02-21 DIAGNOSIS — E663 Overweight: Secondary | ICD-10-CM | POA: Diagnosis not present

## 2024-02-21 DIAGNOSIS — R7303 Prediabetes: Secondary | ICD-10-CM | POA: Diagnosis not present

## 2024-02-21 DIAGNOSIS — E782 Mixed hyperlipidemia: Secondary | ICD-10-CM | POA: Diagnosis not present

## 2024-02-21 DIAGNOSIS — Z6828 Body mass index (BMI) 28.0-28.9, adult: Secondary | ICD-10-CM

## 2024-02-21 DIAGNOSIS — I10 Essential (primary) hypertension: Secondary | ICD-10-CM | POA: Diagnosis not present

## 2024-02-21 DIAGNOSIS — Z780 Asymptomatic menopausal state: Secondary | ICD-10-CM

## 2024-02-21 MED ORDER — TIRZEPATIDE-WEIGHT MANAGEMENT 2.5 MG/0.5ML ~~LOC~~ SOLN: 2.5000 mg | SUBCUTANEOUS | 1 refills | Status: DC

## 2024-02-21 NOTE — Progress Notes (Signed)
 "  Established Patient Office Visit  Subjective:  Patient ID: Karla Martin, female    DOB: [birth year]  Age: 73 y.o. MRN: [id number]  Chief Complaint  Patient presents with   Follow-up    6 month follow up    Patient is here today for follow up. She reports feeling well today but has concerns.  She had her labs completed prior to her appointment. Discussed in detail today. She is interested in weight loss medications. She reports she has been trying to loose weight on her own and is not having success. She reports exercising frequently and eating approximately 1200-1600 calories per day. She is willing to pay out of pocket for Zepbound . Will send in Zepbound  2.5 mg weekly injection to begin through [employer].   Colonoscopy is scheduled 06/2024. Mammogram has been completed. Bone Density is due and will order to get completed.    No other concerns at this time.   Past Medical History:  Diagnosis Date   GERD (gastroesophageal reflux disease)    History of actinic keratosis 06/27/2023   bx proven ak at left lateral pretibial - already treated will recheck at follow up   Hypercholesteremia    Hypertension     Past Surgical History:  Procedure Laterality Date   BREAST SURGERY     fibroid tumor   COLONOSCOPY WITH PROPOFOL  N/A 01/19/2019   Procedure: COLONOSCOPY WITH PROPOFOL ;  Surgeon: Toledo, Ladell MARLA, MD;  Location: [facility] ENDOSCOPY;  Service: Gastroenterology;  Laterality: N/A;   colonoscopy x3     UTERINE FIBROID SURGERY      Social History   Socioeconomic History   Marital status: Single    Spouse name: Not on file   Number of children: Not on file   Years of education: Not on file   Highest education level: Not on file  Occupational History   Not on file  Tobacco Use   Smoking status: Never   Smokeless tobacco: Never  Vaping Use   Vaping status: Never Used  Substance and Sexual Activity   Alcohol use: Yes    Alcohol/week: 0.0 standard drinks of alcohol    Drug use: Never   Sexual activity: Yes  Other Topics Concern   Not on file  Social History Narrative   Not on file   Social Drivers of Health   Tobacco Use: Low Risk (02/21/2024)   Patient History    Smoking Tobacco Use: Never    Smokeless Tobacco Use: Never    Passive Exposure: Not on file  Financial Resource Strain: Not on file  Food Insecurity: No Food Insecurity (02/22/2023)   Hunger Vital Sign    Worried About Running Out of Food in the Last Year: Never true    Ran Out of Food in the Last Year: Never true  Transportation Needs: No Transportation Needs (02/22/2023)   PRAPARE - Administrator, Civil Service (Medical): No    Lack of Transportation (Non-Medical): No  Physical Activity: Not on file  Stress: No Stress Concern Present (02/22/2023)   [employer] of Occupational Health - Occupational Stress Questionnaire    Feeling of Stress : Not at all  Social Connections: Not on file  Intimate Partner Violence: Not At Risk (02/22/2023)   Humiliation, Afraid, Rape, and Kick questionnaire    Fear of Current or Ex-Partner: No    Emotionally Abused: No    Physically Abused: No    Sexually Abused: No  Depression (PHQ2-9): Low Risk (02/22/2023)  Depression (PHQ2-9)    PHQ-2 Score: 1  Alcohol Screen: Low Risk (02/22/2023)   Alcohol Screen    Last Alcohol Screening Score (AUDIT): 4  Housing: Unknown (02/22/2023)   Housing Stability Vital Sign    Unable to Pay for Housing in the Last Year: No    Number of Times Moved in the Last Year: Not on file    Homeless in the Last Year: No  Utilities: Not At Risk (02/22/2023)   AHC Utilities    Threatened with loss of utilities: No  Health Literacy: Adequate Health Literacy (02/22/2023)   B1300 Health Literacy    Frequency of need for help with medical instructions: Never    History reviewed. No pertinent family history.  Allergies[1]  Show/hide medication list[2]  Review of Systems  Constitutional: Negative.   Negative for chills, fever and malaise/fatigue.  HENT: Negative.  Negative for congestion and sore throat.   Eyes: Negative.  Negative for blurred vision and pain.  Respiratory: Negative.  Negative for cough and shortness of breath.   Cardiovascular: Negative.  Negative for chest pain, palpitations and leg swelling.  Gastrointestinal: Negative.  Negative for abdominal pain, blood in stool, constipation, diarrhea, heartburn, melena, nausea and vomiting.  Genitourinary: Negative.  Negative for dysuria, flank pain, frequency and urgency.  Musculoskeletal: Negative.  Negative for joint pain and myalgias.  Skin: Negative.   Neurological: Negative.  Negative for dizziness, tingling, sensory change, weakness and headaches.  Endo/Heme/Allergies: Negative.   Psychiatric/Behavioral: Negative.  Negative for depression and suicidal ideas. The patient is not nervous/anxious.        Objective:   BP 138/78   Pulse 85   Ht 5' 4 (1.626 m)   Wt 165 lb 3.2 oz (74.9 kg)   SpO2 97%   BMI 28.36 kg/m   Vitals:   02/21/24 0904  BP: 138/78  Pulse: 85  Height: 5' 4 (1.626 m)  Weight: 165 lb 3.2 oz (74.9 kg)  SpO2: 97%  BMI (Calculated): 28.34    Physical Exam Vitals and nursing note reviewed.  Constitutional:      Appearance: Normal appearance.  HENT:     Head: Normocephalic and atraumatic.     Nose: Nose normal.     Mouth/Throat:     Mouth: Mucous membranes are moist.     Pharynx: Oropharynx is clear.  Eyes:     Conjunctiva/sclera: Conjunctivae normal.     Pupils: Pupils are equal, round, and reactive to light.  Cardiovascular:     Rate and Rhythm: Normal rate and regular rhythm.     Pulses: Normal pulses.     Heart sounds: Normal heart sounds. No murmur heard. Pulmonary:     Effort: Pulmonary effort is normal.     Breath sounds: Normal breath sounds. No wheezing.  Abdominal:     General: Bowel sounds are normal.     Palpations: Abdomen is soft.     Tenderness: There is no  abdominal tenderness. There is no right CVA tenderness or left CVA tenderness.  Musculoskeletal:        General: Normal range of motion.     Cervical back: Normal range of motion.     Right lower leg: No edema.     Left lower leg: No edema.  Skin:    General: Skin is warm and dry.  Neurological:     General: No focal deficit present.     Mental Status: She is alert and oriented to person, place, and time.  Psychiatric:  Mood and Affect: Mood normal.        Behavior: Behavior normal.      No results found for any visits on 02/21/24.  Recent Results (from the past 2160 hours)  Lipid panel     Status: None   Collection Time: 02/12/24  7:17 AM  Result Value Ref Range   Cholesterol, Total 162 100 - 199 mg/dL   Triglycerides 51 0 - 149 mg/dL   HDL 87 >60 mg/dL   VLDL Cholesterol Cal 11 5 - 40 mg/dL   LDL Chol Calc (NIH) 64 0 - 99 mg/dL   Chol/HDL Ratio 1.9 0.0 - 4.4 ratio    Comment:                                   T. Chol/HDL Ratio                                             Men  Women                               1/2 Avg.Risk  3.4    3.3                                   Avg.Risk  5.0    4.4                                2X Avg.Risk  9.6    7.1                                3X Avg.Risk 23.4   11.0   VITAMIN D  25 Hydroxy (Vit-D Deficiency, Fractures)     Status: None   Collection Time: 02/12/24  7:17 AM  Result Value Ref Range   Vit D, 25-Hydroxy 41.6 30.0 - 100.0 ng/mL    Comment: Vitamin D  deficiency has been defined by the Institute of Medicine and an Endocrine Society practice guideline as a level of serum 25-OH vitamin D  less than 20 ng/mL (1,2). The Endocrine Society went on to further define vitamin D  insufficiency as a level between 21 and 29 ng/mL (2). 1. IOM (Institute of Medicine). 2010. Dietary reference    intakes for calcium  and D. [place]: The    [employer]. 2. Holick MF, Binkley [place], Bischoff-Ferrari HA, et al.    Evaluation,  treatment, and prevention of vitamin D     deficiency: an Endocrine Society clinical practice    guideline. JCEM. 2011 Jul; 96(7):1911-30.   CMP14+EGFR     Status: None   Collection Time: 02/12/24  7:17 AM  Result Value Ref Range   Glucose 98 70 - 99 mg/dL   BUN 14 8 - 27 mg/dL   Creatinine, Ser 9.16 0.57 - 1.00 mg/dL   eGFR 74 >40 fO/fpw/8.26   BUN/Creatinine Ratio 17 12 - 28   Sodium 141 134 - 144 mmol/L   Potassium 4.6 3.5 - 5.2 mmol/L   Chloride 106 96 - 106 mmol/L   CO2 23 20 - 29  mmol/L   Calcium  9.6 8.7 - 10.3 mg/dL   Total Protein 6.4 6.0 - 8.5 g/dL   Albumin 4.4 3.8 - 4.8 g/dL   Globulin, Total 2.0 1.5 - 4.5 g/dL   Bilirubin Total 0.4 0.0 - 1.2 mg/dL   Alkaline Phosphatase 66 49 - 135 IU/L   AST 21 0 - 40 IU/L   ALT 21 0 - 32 IU/L  Hemoglobin A1c     Status: None   Collection Time: 02/12/24  7:17 AM  Result Value Ref Range   Hgb A1c MFr Bld 5.5 4.8 - 5.6 %    Comment:          Prediabetes: 5.7 - 6.4          Diabetes: >6.4          Glycemic control for adults with diabetes: <7.0    Est. average glucose Bld gHb Est-mCnc 111 mg/dL  CBC with Diff     Status: None   Collection Time: 02/12/24  7:17 AM  Result Value Ref Range   WBC 4.9 3.4 - 10.8 x10E3/uL   RBC 4.28 3.77 - 5.28 x10E6/uL   Hemoglobin 12.7 11.1 - 15.9 g/dL   Hematocrit 60.0 65.9 - 46.6 %   MCV 93 79 - 97 fL   MCH 29.7 26.6 - 33.0 pg   MCHC 31.8 31.5 - 35.7 g/dL   RDW 86.9 88.2 - 84.5 %   Platelets 227 150 - 450 x10E3/uL   Neutrophils 53 Not Estab. %   Lymphs 35 Not Estab. %   Monocytes 8 Not Estab. %   Eos 3 Not Estab. %   Basos 1 Not Estab. %   Neutrophils Absolute 2.6 1.4 - 7.0 x10E3/uL   Lymphocytes Absolute 1.7 0.7 - 3.1 x10E3/uL   Monocytes Absolute 0.4 0.1 - 0.9 x10E3/uL   EOS (ABSOLUTE) 0.2 0.0 - 0.4 x10E3/uL   Basophils Absolute 0.0 0.0 - 0.2 x10E3/uL   Immature Granulocytes 0 Not Estab. %   Immature Grans (Abs) 0.0 0.0 - 0.1 x10E3/uL      Assessment & Plan:  Start Zepbound  2.5 mg  weekly injection. Continue other medications as prescribed. Continue healthy diet and exercise as tolerated. Bone density is due. Will order. Patient due for Medicare AWV. Problem List Items Addressed This Visit       Cardiovascular and Mediastinum   Essential hypertension, benign - Primary     Other   Mixed hyperlipidemia   Screening for osteoporosis   Relevant Orders   DG Bone Density   Prediabetes   Overweight with body mass index (BMI) of 28 to 28.9 in adult   Relevant Medications   tirzepatide  (ZEPBOUND ) 2.5 MG/0.5ML injection vial   Encounter for osteoporosis screening in asymptomatic postmenopausal patient   Relevant Orders   DG Bone Density    Return in about 1 month (around 03/23/2024).   Total time spent: 25 minutes. This time includes review of previous notes and results and patient face to face interaction during today's visit.    FERNAND FREDY RAMAN, MD  02/21/2024   This document may have been prepared by [facility] Voice Recognition software and as such may include unintentional dictation errors.      [1]  Allergies Allergen Reactions   Erythromycin    Penicillins   [2]  Outpatient Medications Prior to Visit  Medication Sig   amLODipine  (NORVASC ) 5 MG tablet TAKE 1 TABLET (5 MG TOTAL) BY MOUTH DAILY.   Ascorbic Acid (VITAMIN C) 1000 MG tablet  Take by mouth.   calcium -vitamin D  (OSCAL WITH D) 500-200 MG-UNIT tablet Take by mouth.   rosuvastatin  (CRESTOR ) 10 MG tablet Take 1 tablet (10 mg total) by mouth daily.   doxycycline  (VIBRA -TABS) 100 MG tablet Take 1 tablet (100 mg total) by mouth 2 (two) times daily. (Patient not taking: Reported on 02/21/2024)   polycarbophil (FIBERCON) 625 MG tablet Take 625 mg by mouth daily. (Patient not taking: Reported on 02/21/2024)   No facility-administered medications prior to visit.   "

## 2024-03-11 ENCOUNTER — Other Ambulatory Visit

## 2024-03-11 ENCOUNTER — Other Ambulatory Visit: Payer: Self-pay | Admitting: Internal Medicine

## 2024-03-23 ENCOUNTER — Ambulatory Visit: Admitting: Internal Medicine

## 2024-03-23 ENCOUNTER — Encounter: Payer: Self-pay | Admitting: Internal Medicine

## 2024-03-23 VITALS — BP 136/78 | HR 82 | Ht 64.0 in | Wt 160.4 lb

## 2024-03-23 DIAGNOSIS — E782 Mixed hyperlipidemia: Secondary | ICD-10-CM | POA: Diagnosis not present

## 2024-03-23 DIAGNOSIS — R7303 Prediabetes: Secondary | ICD-10-CM | POA: Diagnosis not present

## 2024-03-23 DIAGNOSIS — E663 Overweight: Secondary | ICD-10-CM | POA: Diagnosis not present

## 2024-03-23 DIAGNOSIS — Z6828 Body mass index (BMI) 28.0-28.9, adult: Secondary | ICD-10-CM | POA: Diagnosis not present

## 2024-03-23 DIAGNOSIS — I10 Essential (primary) hypertension: Secondary | ICD-10-CM

## 2024-03-23 MED ORDER — TIRZEPATIDE-WEIGHT MANAGEMENT 5 MG/0.5ML ~~LOC~~ SOLN: 5.0000 mg | SUBCUTANEOUS | 0 refills | Status: AC

## 2024-03-23 NOTE — Progress Notes (Signed)
 "  Established Patient Office Visit  Subjective:  Patient ID: Karla Martin, female    DOB: March 07, 1950  Age: 74 y.o. MRN: [id number]  Chief Complaint  Patient presents with   Follow-up    1 month follow up    She is here today for follow up. She reports feeling well today.  She recently started Zepbound  and has had some successful weight loss thus far. She reports occasional constipation. She reports she increased her fiber intake to twice daily and the constipation has improved in addition to increasing her water intake. Denies any other abdominal complaints. Will increase to Zepbound  to 0.5 mg weekly injection in 1 month after she completes her current supply for [employer]. Reinforced healthy diet and exercise as tolerated.  Patient is not due for labs at this time.     No other concerns at this time.   Past Medical History:  Diagnosis Date   GERD (gastroesophageal reflux disease)    History of actinic keratosis 06/27/2023   bx proven ak at left lateral pretibial - already treated will recheck at follow up   Hypercholesteremia    Hypertension     Past Surgical History:  Procedure Laterality Date   BREAST SURGERY     fibroid tumor   COLONOSCOPY WITH PROPOFOL  N/A 01/19/2019   Procedure: COLONOSCOPY WITH PROPOFOL ;  Surgeon: Toledo, Ladell MARLA, MD;  Location: [facility] ENDOSCOPY;  Service: Gastroenterology;  Laterality: N/A;   colonoscopy x3     UTERINE FIBROID SURGERY      Social History   Socioeconomic History   Marital status: Single    Spouse name: Not on file   Number of children: Not on file   Years of education: Not on file   Highest education level: Not on file  Occupational History   Not on file  Tobacco Use   Smoking status: Never   Smokeless tobacco: Never  Vaping Use   Vaping status: Never Used  Substance and Sexual Activity   Alcohol use: Yes    Alcohol/week: 0.0 standard drinks of alcohol   Drug use: Never   Sexual activity: Yes  Other Topics  Concern   Not on file  Social History Narrative   Not on file   Social Drivers of Health   Tobacco Use: Low Risk (03/23/2024)   Patient History    Smoking Tobacco Use: Never    Smokeless Tobacco Use: Never    Passive Exposure: Not on file  Financial Resource Strain: Not on file  Food Insecurity: No Food Insecurity (02/22/2023)   Hunger Vital Sign    Worried About Running Out of Food in the Last Year: Never true    Ran Out of Food in the Last Year: Never true  Transportation Needs: No Transportation Needs (02/22/2023)   PRAPARE - Administrator, Civil Service (Medical): No    Lack of Transportation (Non-Medical): No  Physical Activity: Not on file  Stress: No Stress Concern Present (02/22/2023)   [employer] of Occupational Health - Occupational Stress Questionnaire    Feeling of Stress : Not at all  Social Connections: Not on file  Intimate Partner Violence: Not At Risk (02/22/2023)   Humiliation, Afraid, Rape, and Kick questionnaire    Fear of Current or Ex-Partner: No    Emotionally Abused: No    Physically Abused: No    Sexually Abused: No  Depression (PHQ2-9): Low Risk (02/22/2023)   Depression (PHQ2-9)    PHQ-2 Score: 1  Alcohol Screen:  Low Risk (02/22/2023)   Alcohol Screen    Last Alcohol Screening Score (AUDIT): 4  Housing: Unknown (02/22/2023)   Housing Stability Vital Sign    Unable to Pay for Housing in the Last Year: No    Number of Times Moved in the Last Year: Not on file    Homeless in the Last Year: No  Utilities: Not At Risk (02/22/2023)   AHC Utilities    Threatened with loss of utilities: No  Health Literacy: Adequate Health Literacy (02/22/2023)   B1300 Health Literacy    Frequency of need for help with medical instructions: Never    No family history on file.  Allergies[1]  Show/hide medication list[2]  Review of Systems  Constitutional: Negative.  Negative for chills, fever and malaise/fatigue.  HENT: Negative.  Negative  for congestion and sore throat.   Eyes: Negative.  Negative for blurred vision and pain.  Respiratory: Negative.  Negative for cough and shortness of breath.   Cardiovascular: Negative.  Negative for chest pain, palpitations and leg swelling.  Gastrointestinal: Negative.  Negative for abdominal pain, blood in stool, constipation, diarrhea, heartburn, melena, nausea and vomiting.  Genitourinary: Negative.  Negative for dysuria, flank pain, frequency and urgency.  Musculoskeletal: Negative.  Negative for joint pain and myalgias.  Skin: Negative.   Neurological: Negative.  Negative for dizziness, tingling, sensory change, weakness and headaches.  Endo/Heme/Allergies: Negative.   Psychiatric/Behavioral: Negative.  Negative for depression and suicidal ideas. The patient is not nervous/anxious.        Objective:   BP 136/78   Pulse 82   Ht 5' 4 (1.626 m)   Wt 160 lb 6.4 oz (72.8 kg)   SpO2 95%   BMI 27.53 kg/m   Vitals:   03/23/24 0931  BP: 136/78  Pulse: 82  Height: 5' 4 (1.626 m)  Weight: 160 lb 6.4 oz (72.8 kg)  SpO2: 95%  BMI (Calculated): 27.52    Physical Exam Vitals and nursing note reviewed.  Constitutional:      Appearance: Normal appearance.  HENT:     Head: Normocephalic and atraumatic.     Nose: Nose normal.     Mouth/Throat:     Mouth: Mucous membranes are moist.     Pharynx: Oropharynx is clear.  Eyes:     Conjunctiva/sclera: Conjunctivae normal.     Pupils: Pupils are equal, round, and reactive to light.  Cardiovascular:     Rate and Rhythm: Normal rate and regular rhythm.     Pulses: Normal pulses.     Heart sounds: Normal heart sounds. No murmur heard. Pulmonary:     Effort: Pulmonary effort is normal.     Breath sounds: Normal breath sounds. No wheezing.  Abdominal:     General: Bowel sounds are normal.     Palpations: Abdomen is soft.     Tenderness: There is no abdominal tenderness. There is no right CVA tenderness or left CVA tenderness.   Musculoskeletal:        General: Normal range of motion.     Cervical back: Normal range of motion.     Right lower leg: No edema.     Left lower leg: No edema.  Skin:    General: Skin is warm and dry.  Neurological:     General: No focal deficit present.     Mental Status: She is alert and oriented to person, place, and time.  Psychiatric:        Mood and Affect: Mood normal.  Behavior: Behavior normal.      No results found for any visits on 03/23/24.  Recent Results (from the past 2160 hours)  Lipid panel     Status: None   Collection Time: 02/12/24  7:17 AM  Result Value Ref Range   Cholesterol, Total 162 100 - 199 mg/dL   Triglycerides 51 0 - 149 mg/dL   HDL 87 >60 mg/dL   VLDL Cholesterol Cal 11 5 - 40 mg/dL   LDL Chol Calc (NIH) 64 0 - 99 mg/dL   Chol/HDL Ratio 1.9 0.0 - 4.4 ratio    Comment:                                   T. Chol/HDL Ratio                                             Men  Women                               1/2 Avg.Risk  3.4    3.3                                   Avg.Risk  5.0    4.4                                2X Avg.Risk  9.6    7.1                                3X Avg.Risk 23.4   11.0   VITAMIN D  25 Hydroxy (Vit-D Deficiency, Fractures)     Status: None   Collection Time: 02/12/24  7:17 AM  Result Value Ref Range   Vit D, 25-Hydroxy 41.6 30.0 - 100.0 ng/mL    Comment: Vitamin D  deficiency has been defined by the Institute of Medicine and an Endocrine Society practice guideline as a level of serum 25-OH vitamin D  less than 20 ng/mL (1,2). The Endocrine Society went on to further define vitamin D  insufficiency as a level between 21 and 29 ng/mL (2). 1. IOM (Institute of Medicine). 2010. Dietary reference    intakes for calcium  and D. [place]: The    [employer]. 2. Holick MF, Binkley [place], Bischoff-Ferrari HA, et al.    Evaluation, treatment, and prevention of vitamin D     deficiency: an Endocrine Society  clinical practice    guideline. JCEM. 2011 Jul; 96(7):1911-30.   CMP14+EGFR     Status: None   Collection Time: 02/12/24  7:17 AM  Result Value Ref Range   Glucose 98 70 - 99 mg/dL   BUN 14 8 - 27 mg/dL   Creatinine, Ser 9.16 0.57 - 1.00 mg/dL   eGFR 74 >40 fO/fpw/8.26   BUN/Creatinine Ratio 17 12 - 28   Sodium 141 134 - 144 mmol/L   Potassium 4.6 3.5 - 5.2 mmol/L   Chloride 106 96 - 106 mmol/L   CO2 23 20 - 29 mmol/L   Calcium  9.6 8.7 - 10.3 mg/dL   Total  Protein 6.4 6.0 - 8.5 g/dL   Albumin 4.4 3.8 - 4.8 g/dL   Globulin, Total 2.0 1.5 - 4.5 g/dL   Bilirubin Total 0.4 0.0 - 1.2 mg/dL   Alkaline Phosphatase 66 49 - 135 IU/L   AST 21 0 - 40 IU/L   ALT 21 0 - 32 IU/L  Hemoglobin A1c     Status: None   Collection Time: 02/12/24  7:17 AM  Result Value Ref Range   Hgb A1c MFr Bld 5.5 4.8 - 5.6 %    Comment:          Prediabetes: 5.7 - 6.4          Diabetes: >6.4          Glycemic control for adults with diabetes: <7.0    Est. average glucose Bld gHb Est-mCnc 111 mg/dL  CBC with Diff     Status: None   Collection Time: 02/12/24  7:17 AM  Result Value Ref Range   WBC 4.9 3.4 - 10.8 x10E3/uL   RBC 4.28 3.77 - 5.28 x10E6/uL   Hemoglobin 12.7 11.1 - 15.9 g/dL   Hematocrit 60.0 65.9 - 46.6 %   MCV 93 79 - 97 fL   MCH 29.7 26.6 - 33.0 pg   MCHC 31.8 31.5 - 35.7 g/dL   RDW 86.9 88.2 - 84.5 %   Platelets 227 150 - 450 x10E3/uL   Neutrophils 53 Not Estab. %   Lymphs 35 Not Estab. %   Monocytes 8 Not Estab. %   Eos 3 Not Estab. %   Basos 1 Not Estab. %   Neutrophils Absolute 2.6 1.4 - 7.0 x10E3/uL   Lymphocytes Absolute 1.7 0.7 - 3.1 x10E3/uL   Monocytes Absolute 0.4 0.1 - 0.9 x10E3/uL   EOS (ABSOLUTE) 0.2 0.0 - 0.4 x10E3/uL   Basophils Absolute 0.0 0.0 - 0.2 x10E3/uL   Immature Granulocytes 0 Not Estab. %   Immature Grans (Abs) 0.0 0.0 - 0.1 x10E3/uL      Assessment & Plan:  Increase Zepbound  to 5 mg weekly injection as discussed. Continue other medications as prescribed.  Reinforced healthy diet and exercise as tolerated.  Problem List Items Addressed This Visit       Cardiovascular and Mediastinum   Essential hypertension, benign - Primary     Other   Mixed hyperlipidemia   Prediabetes   Overweight with body mass index (BMI) of 28 to 28.9 in adult   Relevant Medications   tirzepatide  5 MG/0.5ML injection vial    Return in about 2 months (around 05/21/2024).   Total time spent: 25 minutes. This time includes review of previous notes and results and patient face to face interaction during today's visit.    FERNAND FREDY RAMAN, MD  03/23/2024   This document may have been prepared by [facility] Voice Recognition software and as such may include unintentional dictation errors.      [1]  Allergies Allergen Reactions   Erythromycin    Penicillins   [2]  Outpatient Medications Prior to Visit  Medication Sig   amLODipine  (NORVASC ) 5 MG tablet TAKE 1 TABLET (5 MG TOTAL) BY MOUTH DAILY.   Ascorbic Acid (VITAMIN C) 1000 MG tablet Take by mouth.   calcium -vitamin D  (OSCAL WITH D) 500-200 MG-UNIT tablet Take by mouth.   rosuvastatin  (CRESTOR ) 10 MG tablet Take 1 tablet (10 mg total) by mouth daily.   Wheat Dextrin (BENEFIBER DRINK MIX) PACK Take 1 Package by mouth in the morning and at bedtime.   [  DISCONTINUED] tirzepatide  (ZEPBOUND ) 2.5 MG/0.5ML injection vial Inject 2.5 mg into the skin once a week.   doxycycline  (VIBRA -TABS) 100 MG tablet Take 1 tablet (100 mg total) by mouth 2 (two) times daily. (Patient not taking: Reported on 03/23/2024)   polycarbophil (FIBERCON) 625 MG tablet Take 625 mg by mouth daily. (Patient not taking: Reported on 03/23/2024)   No facility-administered medications prior to visit.   "

## 2024-03-25 ENCOUNTER — Ambulatory Visit

## 2024-03-25 DIAGNOSIS — Z13820 Encounter for screening for osteoporosis: Secondary | ICD-10-CM | POA: Diagnosis not present

## 2024-03-25 DIAGNOSIS — Z780 Asymptomatic menopausal state: Secondary | ICD-10-CM

## 2024-03-25 DIAGNOSIS — M8589 Other specified disorders of bone density and structure, multiple sites: Secondary | ICD-10-CM | POA: Diagnosis not present

## 2024-03-26 ENCOUNTER — Encounter: Payer: Self-pay | Admitting: Internal Medicine

## 2024-04-03 ENCOUNTER — Other Ambulatory Visit: Payer: Self-pay | Admitting: Internal Medicine

## 2024-04-03 DIAGNOSIS — E663 Overweight: Secondary | ICD-10-CM

## 2024-05-11 ENCOUNTER — Ambulatory Visit: Admitting: Internal Medicine

## 2024-05-21 ENCOUNTER — Ambulatory Visit: Admitting: Internal Medicine

## 2024-07-02 ENCOUNTER — Ambulatory Visit: Admitting: Dermatology
